# Patient Record
Sex: Female | Born: 1945 | Race: White | Hispanic: No | Smoking: Never smoker
Health system: Southern US, Community
[De-identification: ages and names within clinical notes are randomized; demographics above are authoritative.]

## PROBLEM LIST (undated history)

## (undated) DIAGNOSIS — E039 Hypothyroidism, unspecified: Secondary | ICD-10-CM

## (undated) DIAGNOSIS — E079 Disorder of thyroid, unspecified: Secondary | ICD-10-CM

## (undated) DIAGNOSIS — I10 Essential (primary) hypertension: Secondary | ICD-10-CM

## (undated) HISTORY — PX: BLADDER SURGERY: SHX569

---

## 2017-08-14 ENCOUNTER — Inpatient Hospital Stay (HOSPITAL_COMMUNITY)
Admission: EM | Admit: 2017-08-14 | Discharge: 2017-08-19 | DRG: 342 | Disposition: A | Discharge status: Home / Self Care | Payer: Medicare Other | Attending: General Surgery | Admitting: General Surgery

## 2017-08-14 ENCOUNTER — Other Ambulatory Visit: Payer: Self-pay

## 2017-08-14 ENCOUNTER — Emergency Department (HOSPITAL_COMMUNITY): Payer: Medicare Other | Admitting: Anesthesiology

## 2017-08-14 ENCOUNTER — Emergency Department (HOSPITAL_COMMUNITY): Payer: Medicare Other

## 2017-08-14 ENCOUNTER — Encounter (HOSPITAL_COMMUNITY): Admission: EM | Disposition: A | Discharge status: Home / Self Care | Payer: Self-pay | Source: Home / Self Care

## 2017-08-14 ENCOUNTER — Encounter (HOSPITAL_COMMUNITY): Payer: Self-pay | Admitting: Emergency Medicine

## 2017-08-14 DIAGNOSIS — E669 Obesity, unspecified: Secondary | ICD-10-CM | POA: Diagnosis present

## 2017-08-14 DIAGNOSIS — K567 Ileus, unspecified: Secondary | ICD-10-CM | POA: Diagnosis not present

## 2017-08-14 DIAGNOSIS — Z79899 Other long term (current) drug therapy: Secondary | ICD-10-CM

## 2017-08-14 DIAGNOSIS — Z7982 Long term (current) use of aspirin: Secondary | ICD-10-CM

## 2017-08-14 DIAGNOSIS — Z6824 Body mass index (BMI) 24.0-24.9, adult: Secondary | ICD-10-CM

## 2017-08-14 DIAGNOSIS — K3580 Unspecified acute appendicitis: Secondary | ICD-10-CM | POA: Diagnosis present

## 2017-08-14 DIAGNOSIS — E039 Hypothyroidism, unspecified: Secondary | ICD-10-CM | POA: Diagnosis present

## 2017-08-14 DIAGNOSIS — R9389 Abnormal findings on diagnostic imaging of other specified body structures: Secondary | ICD-10-CM | POA: Diagnosis present

## 2017-08-14 DIAGNOSIS — K3532 Acute appendicitis with perforation and localized peritonitis, without abscess: Secondary | ICD-10-CM | POA: Diagnosis present

## 2017-08-14 DIAGNOSIS — I10 Essential (primary) hypertension: Secondary | ICD-10-CM | POA: Diagnosis present

## 2017-08-14 DIAGNOSIS — K352 Acute appendicitis with generalized peritonitis, without abscess: Secondary | ICD-10-CM | POA: Diagnosis present

## 2017-08-14 DIAGNOSIS — E876 Hypokalemia: Secondary | ICD-10-CM | POA: Diagnosis not present

## 2017-08-14 HISTORY — PX: LAPAROSCOPIC APPENDECTOMY: SHX408

## 2017-08-14 HISTORY — DX: Hypothyroidism, unspecified: E03.9

## 2017-08-14 HISTORY — DX: Disorder of thyroid, unspecified: E07.9

## 2017-08-14 HISTORY — DX: Essential (primary) hypertension: I10

## 2017-08-14 LAB — CBC WITH DIFFERENTIAL/PLATELET
BASOS PCT: 0 %
Basophils Absolute: 0 10*3/uL (ref 0.0–0.1)
Eosinophils Absolute: 0 10*3/uL (ref 0.0–0.7)
Eosinophils Relative: 0 %
HEMATOCRIT: 40.5 % (ref 36.0–46.0)
HEMOGLOBIN: 13.7 g/dL (ref 12.0–15.0)
LYMPHS ABS: 0.6 10*3/uL — AB (ref 0.7–4.0)
LYMPHS PCT: 11 %
MCH: 30.2 pg (ref 26.0–34.0)
MCHC: 33.8 g/dL (ref 30.0–36.0)
MCV: 89.2 fL (ref 78.0–100.0)
MONO ABS: 0.5 10*3/uL (ref 0.1–1.0)
MONOS PCT: 8 %
NEUTROS ABS: 4.6 10*3/uL (ref 1.7–7.7)
NEUTROS PCT: 81 %
Platelets: 159 10*3/uL (ref 150–400)
RBC: 4.54 MIL/uL (ref 3.87–5.11)
RDW: 13.1 % (ref 11.5–15.5)
WBC: 5.7 10*3/uL (ref 4.0–10.5)

## 2017-08-14 LAB — URINALYSIS, ROUTINE W REFLEX MICROSCOPIC
BACTERIA UA: NONE SEEN
Bilirubin Urine: NEGATIVE
GLUCOSE, UA: NEGATIVE mg/dL
Hgb urine dipstick: NEGATIVE
Ketones, ur: 20 mg/dL — AB
NITRITE: NEGATIVE
PROTEIN: NEGATIVE mg/dL
SPECIFIC GRAVITY, URINE: 1.038 — AB (ref 1.005–1.030)
pH: 5 (ref 5.0–8.0)

## 2017-08-14 LAB — COMPREHENSIVE METABOLIC PANEL
ALBUMIN: 3.4 g/dL — AB (ref 3.5–5.0)
ALK PHOS: 86 U/L (ref 38–126)
ALT: 17 U/L (ref 14–54)
ANION GAP: 11 (ref 5–15)
AST: 26 U/L (ref 15–41)
BILIRUBIN TOTAL: 1 mg/dL (ref 0.3–1.2)
BUN: 18 mg/dL (ref 6–20)
CALCIUM: 8.4 mg/dL — AB (ref 8.9–10.3)
CO2: 23 mmol/L (ref 22–32)
Chloride: 96 mmol/L — ABNORMAL LOW (ref 101–111)
Creatinine, Ser: 0.81 mg/dL (ref 0.44–1.00)
GFR calc Af Amer: >60 mL/min (ref >60)
GLUCOSE: 125 mg/dL — AB (ref 65–99)
Potassium: 3.6 mmol/L (ref 3.5–5.1)
Sodium: 130 mmol/L — ABNORMAL LOW (ref 135–145)
TOTAL PROTEIN: 6 g/dL — AB (ref 6.5–8.1)

## 2017-08-14 LAB — LIPASE, BLOOD: LIPASE: 16 U/L (ref 11–51)

## 2017-08-14 SURGERY — APPENDECTOMY, LAPAROSCOPIC
Anesthesia: General

## 2017-08-14 MED ORDER — OXYCODONE HCL 5 MG PO TABS: 5.0000 mg | ORAL_TABLET | ORAL | Status: DC | PRN

## 2017-08-14 MED ORDER — ENOXAPARIN SODIUM 40 MG/0.4ML ~~LOC~~ SOLN
40.0000 mg | SUBCUTANEOUS | Status: DC
  Administered 2017-08-15 – 2017-08-19 (×5): 40 mg via SUBCUTANEOUS
  Filled 2017-08-14 (×5): qty 0.4

## 2017-08-14 MED ORDER — ONDANSETRON 4 MG PO TBDP
4.0000 mg | ORAL_TABLET | Freq: Four times a day (QID) | ORAL | Status: DC | PRN
  Filled 2017-08-14: qty 1

## 2017-08-14 MED ORDER — DEXAMETHASONE SODIUM PHOSPHATE 10 MG/ML IJ SOLN
INTRAMUSCULAR | Status: DC | PRN
  Administered 2017-08-14: 10 mg via INTRAVENOUS

## 2017-08-14 MED ORDER — MORPHINE SULFATE (PF) 2 MG/ML IV SOLN: 2.0000 mg | INTRAVENOUS | Status: DC | PRN

## 2017-08-14 MED ORDER — SCOPOLAMINE 1 MG/3DAYS TD PT72
MEDICATED_PATCH | TRANSDERMAL | Status: DC | PRN
  Administered 2017-08-14: 1 via TRANSDERMAL

## 2017-08-14 MED ORDER — ONDANSETRON HCL 4 MG/2ML IJ SOLN
INTRAMUSCULAR | Status: DC | PRN
  Administered 2017-08-14: 4 mg via INTRAVENOUS

## 2017-08-14 MED ORDER — SUGAMMADEX SODIUM 200 MG/2ML IV SOLN
INTRAVENOUS | Status: DC | PRN
  Administered 2017-08-14: 131.6 mg via INTRAVENOUS

## 2017-08-14 MED ORDER — FENTANYL CITRATE (PF) 100 MCG/2ML IJ SOLN
INTRAMUSCULAR | Status: DC | PRN
  Administered 2017-08-14 (×2): 50 ug via INTRAVENOUS

## 2017-08-14 MED ORDER — BUPIVACAINE-EPINEPHRINE (PF) 0.25% -1:200000 IJ SOLN
INTRAMUSCULAR | Status: AC
  Filled 2017-08-14: qty 30

## 2017-08-14 MED ORDER — BUPIVACAINE-EPINEPHRINE 0.25% -1:200000 IJ SOLN
INTRAMUSCULAR | Status: DC | PRN
  Administered 2017-08-14: 15 mL

## 2017-08-14 MED ORDER — FENTANYL CITRATE (PF) 100 MCG/2ML IJ SOLN
50.0000 ug | Freq: Once | INTRAMUSCULAR | Status: AC | PRN
  Administered 2017-08-14: 50 ug via INTRAVENOUS
  Filled 2017-08-14: qty 2

## 2017-08-14 MED ORDER — SUGAMMADEX SODIUM 200 MG/2ML IV SOLN
INTRAVENOUS | Status: AC
  Filled 2017-08-14: qty 2

## 2017-08-14 MED ORDER — LACTATED RINGERS IV SOLN
INTRAVENOUS | Status: DC
  Administered 2017-08-14 – 2017-08-18 (×6): via INTRAVENOUS

## 2017-08-14 MED ORDER — LIDOCAINE 2% (20 MG/ML) 5 ML SYRINGE
INTRAMUSCULAR | Status: DC | PRN
  Administered 2017-08-14: 50 mg via INTRAVENOUS

## 2017-08-14 MED ORDER — SODIUM CHLORIDE 0.9 % IV BOLUS (SEPSIS)
1000.0000 mL | Freq: Once | INTRAVENOUS | Status: AC
  Administered 2017-08-14: 1000 mL via INTRAVENOUS

## 2017-08-14 MED ORDER — LACTATED RINGERS IR SOLN
Status: DC | PRN
  Administered 2017-08-14: 1000 mL

## 2017-08-14 MED ORDER — LACTATED RINGERS IV SOLN
INTRAVENOUS | Status: DC
  Administered 2017-08-14: 1000 mL via INTRAVENOUS
  Administered 2017-08-14: 10:00:00 via INTRAVENOUS

## 2017-08-14 MED ORDER — PIPERACILLIN-TAZOBACTAM 3.375 G IVPB
3.3750 g | Freq: Three times a day (TID) | INTRAVENOUS | Status: DC
  Administered 2017-08-14 – 2017-08-19 (×15): 3.375 g via INTRAVENOUS
  Filled 2017-08-14 (×15): qty 50

## 2017-08-14 MED ORDER — PIPERACILLIN-TAZOBACTAM 3.375 G IVPB 30 MIN
3.3750 g | Freq: Once | INTRAVENOUS | Status: AC
  Administered 2017-08-14: 3.375 g via INTRAVENOUS
  Filled 2017-08-14: qty 50

## 2017-08-14 MED ORDER — SUCCINYLCHOLINE CHLORIDE 200 MG/10ML IV SOSY
PREFILLED_SYRINGE | INTRAVENOUS | Status: DC | PRN
  Administered 2017-08-14: 100 mg via INTRAVENOUS

## 2017-08-14 MED ORDER — PROMETHAZINE HCL 25 MG/ML IJ SOLN: 6.2500 mg | INTRAMUSCULAR | Status: DC | PRN

## 2017-08-14 MED ORDER — LACTATED RINGERS IV SOLN
INTRAVENOUS | Status: DC | PRN
  Administered 2017-08-14 (×2): via INTRAVENOUS

## 2017-08-14 MED ORDER — LOSARTAN POTASSIUM 25 MG PO TABS
12.5000 mg | ORAL_TABLET | Freq: Every day | ORAL | Status: DC
  Administered 2017-08-14 – 2017-08-19 (×6): 12.5 mg via ORAL
  Filled 2017-08-14 (×8): qty 1

## 2017-08-14 MED ORDER — ROCURONIUM BROMIDE 10 MG/ML (PF) SYRINGE
PREFILLED_SYRINGE | INTRAVENOUS | Status: DC | PRN
  Administered 2017-08-14: 30 mg via INTRAVENOUS

## 2017-08-14 MED ORDER — SCOPOLAMINE 1 MG/3DAYS TD PT72
MEDICATED_PATCH | TRANSDERMAL | Status: AC
  Filled 2017-08-14: qty 1

## 2017-08-14 MED ORDER — DIPHENHYDRAMINE HCL 50 MG/ML IJ SOLN
INTRAMUSCULAR | Status: DC | PRN
  Administered 2017-08-14: 12.5 mg via INTRAVENOUS

## 2017-08-14 MED ORDER — DEXAMETHASONE SODIUM PHOSPHATE 10 MG/ML IJ SOLN
INTRAMUSCULAR | Status: AC
  Filled 2017-08-14: qty 1

## 2017-08-14 MED ORDER — PROPOFOL 10 MG/ML IV BOLUS
INTRAVENOUS | Status: DC | PRN
  Administered 2017-08-14: 150 mg via INTRAVENOUS

## 2017-08-14 MED ORDER — IOPAMIDOL (ISOVUE-300) INJECTION 61%
INTRAVENOUS | Status: AC
  Administered 2017-08-14: 100 mL via INTRAVENOUS
  Filled 2017-08-14: qty 100

## 2017-08-14 MED ORDER — LEVOTHYROXINE SODIUM 75 MCG PO TABS
75.0000 ug | ORAL_TABLET | Freq: Every day | ORAL | Status: DC
  Administered 2017-08-14 – 2017-08-19 (×6): 75 ug via ORAL
  Filled 2017-08-14 (×6): qty 1

## 2017-08-14 MED ORDER — PHENYLEPHRINE 40 MCG/ML (10ML) SYRINGE FOR IV PUSH (FOR BLOOD PRESSURE SUPPORT)
PREFILLED_SYRINGE | INTRAVENOUS | Status: DC | PRN
  Administered 2017-08-14 (×2): 120 ug via INTRAVENOUS

## 2017-08-14 MED ORDER — ONDANSETRON HCL 4 MG/2ML IJ SOLN
4.0000 mg | Freq: Once | INTRAMUSCULAR | Status: AC
  Administered 2017-08-14: 4 mg via INTRAVENOUS
  Filled 2017-08-14: qty 2

## 2017-08-14 MED ORDER — SODIUM CHLORIDE 0.9 % IR SOLN
Status: DC | PRN
  Administered 2017-08-14: 3000 mL

## 2017-08-14 MED ORDER — ONDANSETRON HCL 4 MG/2ML IJ SOLN
4.0000 mg | Freq: Four times a day (QID) | INTRAMUSCULAR | Status: DC | PRN
  Administered 2017-08-16 – 2017-08-17 (×3): 4 mg via INTRAVENOUS
  Filled 2017-08-14 (×3): qty 2

## 2017-08-14 MED ORDER — IOPAMIDOL (ISOVUE-300) INJECTION 61%
100.0000 mL | Freq: Once | INTRAVENOUS | Status: AC | PRN
  Administered 2017-08-14: 100 mL via INTRAVENOUS

## 2017-08-14 MED ORDER — FENTANYL CITRATE (PF) 100 MCG/2ML IJ SOLN
50.0000 ug | Freq: Once | INTRAMUSCULAR | Status: AC
  Administered 2017-08-14: 50 ug via INTRAVENOUS
  Filled 2017-08-14: qty 2

## 2017-08-14 MED ORDER — HYDROMORPHONE HCL 1 MG/ML IJ SOLN: 0.2500 mg | INTRAMUSCULAR | Status: DC | PRN

## 2017-08-14 MED ORDER — FENTANYL CITRATE (PF) 100 MCG/2ML IJ SOLN
INTRAMUSCULAR | Status: AC
  Filled 2017-08-14: qty 2

## 2017-08-14 MED ORDER — ACETAMINOPHEN 325 MG PO TABS
650.0000 mg | ORAL_TABLET | Freq: Four times a day (QID) | ORAL | Status: DC | PRN
Start: 2017-08-14 — End: 2017-08-19

## 2017-08-14 MED ORDER — DIPHENHYDRAMINE HCL 50 MG/ML IJ SOLN
INTRAMUSCULAR | Status: AC
  Filled 2017-08-14: qty 1

## 2017-08-14 MED ORDER — ACETAMINOPHEN 650 MG RE SUPP: 650.0000 mg | Freq: Four times a day (QID) | RECTAL | Status: DC | PRN

## 2017-08-14 SURGICAL SUPPLY — 34 items
APPLIER CLIP 5 13 M/L LIGAMAX5 (MISCELLANEOUS)
APPLIER CLIP ROT 10 11.4 M/L (STAPLE)
CABLE HIGH FREQUENCY MONO STRZ (ELECTRODE) IMPLANT
CHLORAPREP W/TINT 26ML (MISCELLANEOUS) ×3 IMPLANT
CLIP APPLIE 5 13 M/L LIGAMAX5 (MISCELLANEOUS) IMPLANT
CLIP APPLIE ROT 10 11.4 M/L (STAPLE) IMPLANT
COVER SURGICAL LIGHT HANDLE (MISCELLANEOUS) ×3 IMPLANT
CUTTER FLEX LINEAR 45M (STAPLE) ×3 IMPLANT
DECANTER SPIKE VIAL GLASS SM (MISCELLANEOUS) ×3 IMPLANT
DERMABOND ADVANCED (GAUZE/BANDAGES/DRESSINGS) ×2
DERMABOND ADVANCED .7 DNX12 (GAUZE/BANDAGES/DRESSINGS) ×1 IMPLANT
DRAPE LAPAROSCOPIC ABDOMINAL (DRAPES) ×3 IMPLANT
ELECT REM PT RETURN 15FT ADLT (MISCELLANEOUS) ×3 IMPLANT
GLOVE BIOGEL PI IND STRL 7.5 (GLOVE) ×1 IMPLANT
GLOVE BIOGEL PI INDICATOR 7.5 (GLOVE) ×2
GLOVE ECLIPSE 7.5 STRL STRAW (GLOVE) ×3 IMPLANT
GOWN STRL REUS W/TWL XL LVL3 (GOWN DISPOSABLE) ×6 IMPLANT
KIT BASIN OR (CUSTOM PROCEDURE TRAY) ×3 IMPLANT
PAD POSITIONING PINK XL (MISCELLANEOUS) ×3 IMPLANT
POUCH SPECIMEN RETRIEVAL 10MM (ENDOMECHANICALS) ×3 IMPLANT
RELOAD 45 VASCULAR/THIN (ENDOMECHANICALS) IMPLANT
RELOAD STAPLE TA45 3.5 REG BLU (ENDOMECHANICALS) ×3 IMPLANT
SCISSORS LAP 5X35 DISP (ENDOMECHANICALS) ×3 IMPLANT
SET IRRIG TUBING LAPAROSCOPIC (IRRIGATION / IRRIGATOR) ×3 IMPLANT
SHEARS HARMONIC ACE PLUS 36CM (ENDOMECHANICALS) ×3 IMPLANT
SLEEVE XCEL OPT CAN 5 100 (ENDOMECHANICALS) ×3 IMPLANT
SUT MNCRL AB 4-0 PS2 18 (SUTURE) ×3 IMPLANT
TOWEL OR 17X26 10 PK STRL BLUE (TOWEL DISPOSABLE) ×3 IMPLANT
TRAY FOLEY BAG SILVER LF 14FR (CATHETERS) ×3 IMPLANT
TRAY FOLEY W/METER SILVER 16FR (SET/KITS/TRAYS/PACK) IMPLANT
TRAY LAPAROSCOPIC (CUSTOM PROCEDURE TRAY) ×3 IMPLANT
TROCAR BLADELESS OPT 5 100 (ENDOMECHANICALS) ×3 IMPLANT
TROCAR XCEL BLUNT TIP 100MML (ENDOMECHANICALS) ×3 IMPLANT
TUBING INSUF HEATED (TUBING) ×3 IMPLANT

## 2017-08-14 NOTE — Op Note (Signed)
Preoperative Diagnosis: Acute appendicitis, unspecified acute appendicitis type [K35.80]  Postoprative Diagnosis: Perforated appendicitis with diffuse peritonitis  Procedure: Procedure(s): APPENDECTOMY LAPAROSCOPIC    Surgeon: Glenna FellowsHoxworth, Judson Tsan T   Assistants: None  Anesthesia:  General endotracheal anesthesia  Indications: Patient is a 71 year old female who presents with 48 hours of progressive lower abdominal pain with nausea and vomiting.  CT scan has shown evidence of acute appendicitis and extensive inflammatory process in the right lower quadrant with possible perforation.  She is markedly tender.  After discussion we have elected to proceed with emergency laparoscopic and possible open appendectomy.  The procedure and alternatives and risks have been discussed in detailed elsewhere and she agrees to proceed.    Procedure Detail: Patient had received preoperative broad-spectrum IV antibiotics and IV fluids.  She was taken to the operating room, placed in the supine position on the operating table, and general endotracheal anesthesia induced.  The abdomen was widely sterilely prepped and draped.  Foley catheter had been placed.  Patient timeout was performed and correct procedure verified.  Access was obtained with a 1-1/2 cm incision at the umbilicus with an open Hassan cannula through a mattress suture of 0 Vicryl and pneumoperitoneum established.  Under direct vision 5 mm trochars were placed in the epigastrium and left lower quadrant.  There was diffuse exudative peritonitis and some feculent contamination along the right paracolic gutter and in the right lower quadrant.  Inflammatory small bowel adhesions were carefully broken up and small bowel loops mobilized out of the right lower quadrant and off of the cecum.  The appendix was exposed lying just beneath the cecum.  It was severely inflamed with a area of gangrene and perforation about a centimeter from the base of the appendix.   However the appendix could be well exposed and the anatomy delineated.  Peritoneal attachments were divided laterally mobilizing the appendix and tip of the cecum.  The thickened peritoneum and mesoappendix were then sequentially divided with the harmonic scalpel.  I continued to dissect along the appendix proximal to the perforation and inflammatory adhesions to the cecum were carefully taken down with mostly careful blunt dissection and further mesoappendix divided with the harmonic scalpel until the appendix was completely freed down to the tip of the cecum.  This was healthy viable tissue about a centimeter to a centimeter and a half proximal to the perforation.  The appendix was divided along the tip of the cecum with a single firing of the 45 mm blue load stapler.  The appendix was placed in an Endo Catch bag and brought out through the umbilical incision.  The abdomen was then thoroughly irrigated with multiple liters of saline.  All interloop inflammatory adhesions were carefully broken up and irrigated.  The pelvis and cul-de-sac were thoroughly irrigated from purulent and somewhat feculent fluid.  Right subhepatic space contained purulent and slightly feculent fluid that was thoroughly irrigated and suctioned.  At the conclusion there was no bleeding or further evidence of contamination remaining or evidence of injury.  CT scan had indicated ovarian cysts and I did take photographs of both ovaries which appeared normal but there was a simple appearing cyst associated with the right fallopian tube.  The abdomen was again inspected and no problems seen.  All CO2 was evacuated and trochars removed and the mattress suture secured to the umbilicus.  Skin incisions were closed with subcuticular Monocryl and Dermabond.  Sponge needle and instrument counts were correct.    Findings: Perforated appendicitis with  diffuse peritonitis  Estimated Blood Loss:  Minimal         Drains: None  Blood Given: none           Specimens: Appendix        Complications:  * No complications entered in OR log *         Disposition: PACU - hemodynamically stable.         Condition: stable

## 2017-08-14 NOTE — ED Provider Notes (Signed)
[facility]-EMERGENCY DEPT Provider Note   CSN: [id number] Arrival date & time: 08/14/17  0047    History   Chief Complaint Chief Complaint  Patient presents with  . Abdominal Pain    HPI Harless NakayamaWinnie Endicott is a 71 y.o. female.   71 year old female presents to the emergency department for evaluation of abdominal pain.  She states that abdominal pain began on Wednesday morning in her upper abdomen and has since migrated to her lower abdomen.  Initially pain was intermittent, but it has become more constant tonight; waxing and waning in severity.  She reports a temperature of 100.2 F prior to arrival for which she took Aleve.  She is concerned that her symptoms may be secondary to consumption of Romaine lettuce on Tuesday.  She has had anorexia with decreased stooling, but no diarrhea, melena, hematochezia. No dysuria, hematuria, vomiting. She does c/o some nausea.  No hx of abdominal surgeries.  Last BM was today, small.      Past Medical History:  Diagnosis Date  . Hypertension   . Hypothyroidism   . Thyroid disease     There are no active problems to display for this patient.   Past Surgical History:  Procedure Laterality Date  . BLADDER SURGERY      OB History    No data available       Home Medications    Prior to Admission medications   Not on File    Family History History reviewed. No pertinent family history.  Social History Social History   Tobacco Use  . Smoking status: Never Smoker  . Smokeless tobacco: Never Used  Substance Use Topics  . Alcohol use: No    Frequency: Never  . Drug use: No     Allergies   Patient has no known allergies.   Review of Systems Review of Systems Ten systems reviewed and are negative for acute change, except as noted in the HPI.    Physical Exam Updated Vital Signs BP 104/64   Pulse 72   Temp 97.6 F (36.4 C) (Oral)   Resp 18   Ht 5' 4.5" (1.638 m)   Wt 65.8 kg (145 lb)   SpO2  91%   BMI 24.50 kg/m   Physical Exam  Constitutional: She is oriented to person, place, and time. She appears well-developed and well-nourished. No distress.  Nontoxic appearing and in NAD  HENT:  Head: Normocephalic and atraumatic.  Eyes: Conjunctivae and EOM are normal. No scleral icterus.  Neck: Normal range of motion.  Cardiovascular: Normal rate, regular rhythm and intact distal pulses.  Pulmonary/Chest: Effort normal. No stridor. No respiratory distress. She has no wheezes.  Lungs CTAB  Abdominal: Soft. There is tenderness (generalized, but worse in the lower abdomen). There is guarding (voluntary).  Musculoskeletal: Normal range of motion.  Neurological: She is alert and oriented to person, place, and time. She exhibits normal muscle tone. Coordination normal.  GCS 15. Patient moving all extremities.  Skin: Skin is warm and dry. No rash noted. She is not diaphoretic. No erythema. No pallor.  Psychiatric: She has a normal mood and affect. Her behavior is normal.  Nursing note and vitals reviewed.    ED Treatments / Results  Labs (all labs ordered are listed, but only abnormal results are displayed) Labs Reviewed  CBC WITH DIFFERENTIAL/PLATELET - Abnormal; Notable for the following components:      Result Value   Lymphs Abs 0.6 (*)    All other components  within normal limits  COMPREHENSIVE METABOLIC PANEL - Abnormal; Notable for the following components:   Sodium 130 (*)    Chloride 96 (*)    Glucose, Bld 125 (*)    Calcium 8.4 (*)    Total Protein 6.0 (*)    Albumin 3.4 (*)    All other components within normal limits  URINALYSIS, ROUTINE W REFLEX MICROSCOPIC - Abnormal; Notable for the following components:   Specific Gravity, Urine 1.038 (*)    Ketones, ur 20 (*)    Leukocytes, UA TRACE (*)    Squamous Epithelial / LPF 0-5 (*)    All other components within normal limits  LIPASE, BLOOD    EKG  EKG Interpretation None       Radiology Ct Abdomen Pelvis  W Contrast  Result Date: 08/14/2017 CLINICAL DATA:  Lower abdominal pain. Romain lettuce ingestion 08/11/2017, 3 days prior. EXAM: CT ABDOMEN AND PELVIS WITH CONTRAST TECHNIQUE: Multidetector CT imaging of the abdomen and pelvis was performed using the standard protocol following bolus administration of intravenous contrast. CONTRAST:  100 cc Isovue-300 IV COMPARISON:  None. FINDINGS: Lower chest: Small right pleural effusion and lower lobe atelectasis. Streaky lingular and left lower lobe atelectasis. Hepatobiliary: No focal hepatic lesion. Gallbladder is physiologically distended without calcified gallstone. Small amount stranding about the gallbladder fundus is felt be tracking from right lower quadrant inflammatory process. Pancreas: No ductal dilatation or inflammation. Spleen: Normal in size without focal abnormality. Adrenals/Urinary Tract: No adrenal nodule. Small low-density lesions throughout both kidneys, majority are subcentimeter and too small to characterized, larger lesion in the lower right kidney is consistent with simple cyst. Urinary bladder is physiologically distended. Stomach/Bowel: Stomach is nondistended. Proximal small bowel is decompressed. Pelvic small bowel loops are fluid-filled with wall thickening and mild mucosal enhancement. Dilated blind-ending tubular structure in the right lower quadrant consistent with dilated appendix. Significant right lower quadrant inflammatory change. Cecum is located into the right mid abdomen. Significant colonic tortuosity. Moderate sigmoid diverticulosis without diverticulitis. Appendix: Location: Retrocecal, however cecum located in the mid abdomen. Diameter: 16 mm. Appendicolith: Yes, at least 3, largest measuring 8 mm at the base. Mucosal hyper-enhancement: Yes. Extraluminal gas: No. Periappendiceal collection: Free fluid in the right lower quadrant and right pericolic gutter, with possible organizing collection in the pericolic gutter, for example  image 50 series 2. Vascular/Lymphatic: Tortuous abdominal aorta which trace atherosclerosis. No enlarged abdominal or pelvic lymph nodes. Reproductive: Multi septated versus 2 adjacent cysts in the left ovary, with cystic components measuring 13 and 19 mm. Right ovary not definitively seen. Probable exophytic partially calcified uterine fibroid on the left. Endometrial thickening or fluid measuring 12 mm in the fundus. Other: Significant inflammatory change in the pelvis and right lower quadrant with soft tissue stranding and free fluid. Mesenteric edema and free fluid. Free fluid tracks in both pericolic gutters, left greater than right, with possible ill-defined organizing collection in the right lower quadrant. No free air. Musculoskeletal: Scoliosis and degenerative change in the spine. There are no acute or suspicious osseous abnormalities. IMPRESSION: 1. Findings consistent with acute appendicitis. Significantly inflamed appendix containing multiple appendicoliths. The degree of inflammation, free fluid and probable developing organizing fluid collection in the right pericolic gutter are suspicious for rupture. No extraluminal air. Cecum is located in the right mid abdomen partially distorting conventional anatomy. 2. Thickened pelvic bowel loops with mucosal enhancement likely reactive secondary to the right lower quadrant inflammatory process. 3. Colonic diverticulosis without diverticulitis. 4. Left ovarian cysts versus septated  cyst. If 2 adjacent cysts, cysts measures 19 and 13 mm respectively. These are probably benign, however recommend sonographic characterization on a nonemergent basis after coalescence of acute event. Additionally there is endometrial thickening or fluid of 12 mm, abnormal in a postmenopausal patient. This should also be assessed with ultrasound. Electronically Signed   By: Rubye OaksMelanie  Ehinger M.D.   On: 08/14/2017 04:04    Procedures Procedures (including critical care  time)  Medications Ordered in ED Medications  piperacillin-tazobactam (ZOSYN) IVPB 3.375 g (not administered)  sodium chloride 0.9 % bolus 1,000 mL (not administered)  fentaNYL (SUBLIMAZE) injection 50 mcg (50 mcg Intravenous Given 08/14/17 0143)  ondansetron (ZOFRAN) injection 4 mg (4 mg Intravenous Given 08/14/17 0143)  sodium chloride 0.9 % bolus 1,000 mL (0 mLs Intravenous Stopped 08/14/17 0327)  iopamidol (ISOVUE-300) 61 % injection 100 mL (100 mLs Intravenous Contrast Given 08/14/17 0328)    4:35 AM Zosyn ordered. Case discussed with Dr. Johna SheriffHoxworth of general surgery who will evaluate; anticipate admission.   Initial Impression / Assessment and Plan / ED Course  I have reviewed the triage vital signs and the nursing notes.  Pertinent labs & imaging results that were available during my care of the patient were reviewed by me and considered in my medical decision making (see chart for details).     71 year old female presents to the emergency department for evaluation of abdominal pain with associated nausea and low-grade fever of 100.2 F at home.  She has a generally tender abdomen which is appreciated to be worse on palpation of the bilateral lower quadrants.  Laboratory workup reassuring and vitals have been stable.  A CT was obtained which shows findings concerning for acute appendicitis; question rupture. Zosyn initiated given age and CT read. CCS to evaluate in the ED; anticipate admission to surgical service.   Final Clinical Impressions(s) / ED Diagnoses   Final diagnoses:  Acute appendicitis, unspecified acute appendicitis type    ED Discharge Orders    None       Antony MaduraHumes, Kamorah Nevils, PA-C 08/14/17 [id number]    [name], Christopher J, MD 08/14/17 [phone]

## 2017-08-14 NOTE — ED Triage Notes (Signed)
Pt brought in by EMS from home  Pt ate romaine lettuce Tuesday evening and then heard about the e-coli outbreak [facility]  Pt states her abdomen started hurting tueday night after eating the lettuce and has been hurting ever since progressively getting worse  Pt had one episode of vomiting today  Pt reports having fevers that she has been taking aleve for  Pt is c/o lower abd pain

## 2017-08-14 NOTE — H&P (Signed)
Miranda Randall is an 71 y.o. female.    Chief Complaint: Abdominal pain  HPI: Patient is a pleasant 71 year old female who about 48 hours ago developed the gradual onset of aching epigastric pain.  This was followed soon after by nausea and vomiting.  She rested in bed all day thinking she might have food poisoning.  She did have some diarrhea as well.  Over the course of the day the pain gradually worsened and began to move into her lower abdomen.  Yesterday the pain persisted and her lower abdomen.  She describes aching pain across her lower abdomen greater in the right than the left.  She had continued nausea.  No urinary symptoms.  The pain gradually intensified and she presented to the emergency room late last night/early this morning.  Denies any chronic GI symptoms or similar previous illnesses.  Past Medical History:  Diagnosis Date  . Hypertension   . Hypothyroidism   . Thyroid disease     Past Surgical History:  Procedure Laterality Date  . BLADDER SURGERY      History reviewed. No pertinent family history. Social History:  reports that  has never smoked. she has never used smokeless tobacco. She reports that she does not drink alcohol or use drugs.  Allergies: No Known Allergies  No current facility-administered medications for this encounter.    Current Outpatient Medications  Medication Sig Dispense Refill  . aspirin 81 MG chewable tablet Chew 81 mg by mouth daily.    . calcium carbonate (OS-CAL - DOSED IN MG OF ELEMENTAL CALCIUM) 1250 (500 Ca) MG tablet Take 1 tablet by mouth daily with breakfast.    . cholecalciferol (VITAMIN D) 1000 units tablet Take 1,000 Units by mouth daily.    Marland Kitchen levothyroxine (SYNTHROID, LEVOTHROID) 75 MCG tablet Take 75 mcg by mouth daily.    Marland Kitchen losartan (COZAAR) 25 MG tablet Take 12.5 mg by mouth daily.    . Multiple Vitamin (MULTIVITAMIN WITH MINERALS) TABS tablet Take 1 tablet by mouth daily.       Results for orders placed or performed  during the hospital encounter of 08/14/17 (from the past 48 hour(s))  CBC with Differential     Status: Abnormal   Collection Time: 08/14/17 12:55 AM  Result Value Ref Range   WBC 5.7 4.0 - 10.5 K/uL   RBC 4.54 3.87 - 5.11 MIL/uL   Hemoglobin 13.7 12.0 - 15.0 g/dL   HCT 40.5 36.0 - 46.0 %   MCV 89.2 78.0 - 100.0 fL   MCH 30.2 26.0 - 34.0 pg   MCHC 33.8 30.0 - 36.0 g/dL   RDW 13.1 11.5 - 15.5 %   Platelets 159 150 - 400 K/uL   Neutrophils Relative % 81 %   Neutro Abs 4.6 1.7 - 7.7 K/uL   Lymphocytes Relative 11 %   Lymphs Abs 0.6 (L) 0.7 - 4.0 K/uL   Monocytes Relative 8 %   Monocytes Absolute 0.5 0.1 - 1.0 K/uL   Eosinophils Relative 0 %   Eosinophils Absolute 0.0 0.0 - 0.7 K/uL   Basophils Relative 0 %   Basophils Absolute 0.0 0.0 - 0.1 K/uL  Comprehensive metabolic panel     Status: Abnormal   Collection Time: 08/14/17 12:55 AM  Result Value Ref Range   Sodium 130 (L) 135 - 145 mmol/L   Potassium 3.6 3.5 - 5.1 mmol/L   Chloride 96 (L) 101 - 111 mmol/L   CO2 23 22 - 32 mmol/L   Glucose, Bld  125 (H) 65 - 99 mg/dL   BUN 18 6 - 20 mg/dL   Creatinine, Ser 0.81 0.44 - 1.00 mg/dL   Calcium 8.4 (L) 8.9 - 10.3 mg/dL   Total Protein 6.0 (L) 6.5 - 8.1 g/dL   Albumin 3.4 (L) 3.5 - 5.0 g/dL   AST 26 15 - 41 U/L   ALT 17 14 - 54 U/L   Alkaline Phosphatase 86 38 - 126 U/L   Total Bilirubin 1.0 0.3 - 1.2 mg/dL   GFR calc non Af Amer >60 >60 mL/min   GFR calc Af Amer >60 >60 mL/min    Comment: (NOTE) The eGFR has been calculated using the CKD EPI equation. This calculation has not been validated in all clinical situations. eGFR's persistently <60 mL/min signify possible Chronic Kidney Disease.    Anion gap 11 5 - 15  Lipase, blood     Status: None   Collection Time: 08/14/17 12:55 AM  Result Value Ref Range   Lipase 16 11 - 51 U/L  Urinalysis, Routine w reflex microscopic     Status: Abnormal   Collection Time: 08/14/17 12:55 AM  Result Value Ref Range   Color, Urine YELLOW  YELLOW   APPearance CLEAR CLEAR   Specific Gravity, Urine 1.038 (H) 1.005 - 1.030   pH 5.0 5.0 - 8.0   Glucose, UA NEGATIVE NEGATIVE mg/dL   Hgb urine dipstick NEGATIVE NEGATIVE   Bilirubin Urine NEGATIVE NEGATIVE   Ketones, ur 20 (A) NEGATIVE mg/dL   Protein, ur NEGATIVE NEGATIVE mg/dL   Nitrite NEGATIVE NEGATIVE   Leukocytes, UA TRACE (A) NEGATIVE   RBC / HPF 0-5 0 - 5 RBC/hpf   WBC, UA 0-5 0 - 5 WBC/hpf   Bacteria, UA NONE SEEN NONE SEEN   Squamous Epithelial / LPF 0-5 (A) NONE SEEN   Mucus PRESENT    Hyaline Casts, UA PRESENT    Ct Abdomen Pelvis W Contrast  Result Date: 08/14/2017 CLINICAL DATA:  Lower abdominal pain. Romain lettuce ingestion 08/11/2017, 3 days prior. EXAM: CT ABDOMEN AND PELVIS WITH CONTRAST TECHNIQUE: Multidetector CT imaging of the abdomen and pelvis was performed using the standard protocol following bolus administration of intravenous contrast. CONTRAST:  100 cc Isovue-300 IV COMPARISON:  None. FINDINGS: Lower chest: Small right pleural effusion and lower lobe atelectasis. Streaky lingular and left lower lobe atelectasis. Hepatobiliary: No focal hepatic lesion. Gallbladder is physiologically distended without calcified gallstone. Small amount stranding about the gallbladder fundus is felt be tracking from right lower quadrant inflammatory process. Pancreas: No ductal dilatation or inflammation. Spleen: Normal in size without focal abnormality. Adrenals/Urinary Tract: No adrenal nodule. Small low-density lesions throughout both kidneys, majority are subcentimeter and too small to characterized, larger lesion in the lower right kidney is consistent with simple cyst. Urinary bladder is physiologically distended. Stomach/Bowel: Stomach is nondistended. Proximal small bowel is decompressed. Pelvic small bowel loops are fluid-filled with wall thickening and mild mucosal enhancement. Dilated blind-ending tubular structure in the right lower quadrant consistent with dilated  appendix. Significant right lower quadrant inflammatory change. Cecum is located into the right mid abdomen. Significant colonic tortuosity. Moderate sigmoid diverticulosis without diverticulitis. Appendix: Location: Retrocecal, however cecum located in the mid abdomen. Diameter: 16 mm. Appendicolith: Yes, at least 3, largest measuring 8 mm at the base. Mucosal hyper-enhancement: Yes. Extraluminal gas: No. Periappendiceal collection: Free fluid in the right lower quadrant and right pericolic gutter, with possible organizing collection in the pericolic gutter, for example image 50 series 2. Vascular/Lymphatic: Tortuous  abdominal aorta which trace atherosclerosis. No enlarged abdominal or pelvic lymph nodes. Reproductive: Multi septated versus 2 adjacent cysts in the left ovary, with cystic components measuring 13 and 19 mm. Right ovary not definitively seen. Probable exophytic partially calcified uterine fibroid on the left. Endometrial thickening or fluid measuring 12 mm in the fundus. Other: Significant inflammatory change in the pelvis and right lower quadrant with soft tissue stranding and free fluid. Mesenteric edema and free fluid. Free fluid tracks in both pericolic gutters, left greater than right, with possible ill-defined organizing collection in the right lower quadrant. No free air. Musculoskeletal: Scoliosis and degenerative change in the spine. There are no acute or suspicious osseous abnormalities. IMPRESSION: 1. Findings consistent with acute appendicitis. Significantly inflamed appendix containing multiple appendicoliths. The degree of inflammation, free fluid and probable developing organizing fluid collection in the right pericolic gutter are suspicious for rupture. No extraluminal air. Cecum is located in the right mid abdomen partially distorting conventional anatomy. 2. Thickened pelvic bowel loops with mucosal enhancement likely reactive secondary to the right lower quadrant inflammatory  process. 3. Colonic diverticulosis without diverticulitis. 4. Left ovarian cysts versus septated cyst. If 2 adjacent cysts, cysts measures 19 and 13 mm respectively. These are probably benign, however recommend sonographic characterization on a nonemergent basis after coalescence of acute event. Additionally there is endometrial thickening or fluid of 12 mm, abnormal in a postmenopausal patient. This should also be assessed with ultrasound. Electronically Signed   By: Jeb Levering M.D.   On: 08/14/2017 04:04    Review of Systems  Constitutional: Positive for malaise/fatigue. Negative for chills and fever.  Respiratory: Negative.   Cardiovascular: Negative.   Gastrointestinal: Positive for abdominal pain, diarrhea, nausea and vomiting. Negative for blood in stool and constipation.  Genitourinary: Negative.     Blood pressure (!) 103/53, pulse 69, temperature 97.6 F (36.4 C), temperature source Oral, resp. rate 18, height 5' 4.5" (1.638 m), weight 65.8 kg (145 lb), SpO2 90 %. Physical Exam  General: Alert, mildly obese Caucasian female, in no distress Skin: Warm and dry without rash or infection. HEENT: No palpable masses or thyromegaly. Sclera nonicteric. Pupils equal round and reactive. Lymph nodes: No cervical, supraclavicular,  nodes palpable. Lungs: Breath sounds clear and equal without increased work of breathing Cardiovascular: Regular rate and rhythm without murmur. No JVD or edema. Peripheral pulses intact. Abdomen: Nondistended.  Bowel sounds are hypoactive.  There is tenderness across the lower abdomen with guarding but greater on the right than the left.  Some tenderness in the right upper quadrant and left upper quadrant relatively soft and nontender.  No masses palpable. No organomegaly. No palpable hernias. Extremities: No edema or joint swelling or deformity. No chronic venous stasis changes. Neurologic: Alert and fully oriented.  Affect normal.  No motor  deficits.  Assessment/Plan 48-hour illness with abdominal pain and nausea and vomiting, marked lower abdominal tenderness greater right than left and CT scan showing evidence of significant appendicitis with appendicolith, possible perforation with fairly extensive inflammatory process in the right lower quadrant.  There is no drainable fluid collection.  I think the best course is to proceed with appendectomy.  We will approach laparoscopically but discussed this may require an open procedure.  Discussed alternatives of treatment with antibiotics which since she is only been sick for 48 hours and no drainable fluid collection I would lean toward immediate surgery.  This is what she firmly wants.  We discussed risks of surgery including anesthetic or medication reactions,  cardiorespiratory complications, bleeding, infection, visceral injury.  All questions answered and she desires to proceed.  Edward Jolly, MD 08/14/2017, 6:35 AM

## 2017-08-14 NOTE — ED Notes (Signed)
Dr. Johna SheriffHoxworth, general surgeon, at bedside.

## 2017-08-14 NOTE — ED Provider Notes (Signed)
Patient presented to the ER with abdominal pain.  Symptoms ongoing for 1-2 days.  Pain is diffuse in nature, has had some mild nausea.  No diarrhea or constipation.  Face to face Exam: HEENT - PERRLA Lungs - CTAB Heart - RRR, no M/R/G Abd -no distention, diffuse tenderness, no guarding or rebound Neuro - alert, oriented x3  Plan: Will obtain CT scan to further evaluate.   [name], [name], MD 08/14/17 [phone]

## 2017-08-14 NOTE — Anesthesia Procedure Notes (Addendum)
Procedure Name: Intubation Date/Time: 08/14/2017 7:51 AM Performed by: [place], Raiyan Dalesandro C, CRNA Pre-anesthesia Checklist: Patient identified, Emergency Drugs available, Suction available and Patient being monitored Patient Re-evaluated:Patient Re-evaluated prior to induction Oxygen Delivery Method: Circle system utilized Preoxygenation: Pre-oxygenation with 100% oxygen Induction Type: IV induction, Cricoid Pressure applied and Rapid sequence Ventilation: Mask ventilation without difficulty Laryngoscope Size: Miller and 2 Grade View: Grade I Tube type: Oral Tube size: 7.0 mm Number of attempts: 1 Airway Equipment and Method: Stylet and Oral airway Placement Confirmation: ETT inserted through vocal cords under direct vision,  positive ETCO2 and breath sounds checked- equal and bilateral Tube secured with: Tape Dental Injury: Teeth and Oropharynx as per pre-operative assessment  Comments: Intubation by Dr. Krista BlueSinger

## 2017-08-14 NOTE — Anesthesia Preprocedure Evaluation (Addendum)
Anesthesia Evaluation  Patient identified by MRN, date of birth, ID band Patient awake    Reviewed: Allergy & Precautions, NPO status , Patient's Chart, lab work & pertinent test results  History of Anesthesia Complications Negative for: history of anesthetic complications  Airway Mallampati: II  TM Distance: >3 FB Neck ROM: Full    Dental no notable dental hx. (+) Dental Advisory Given   Pulmonary neg pulmonary ROS,    Pulmonary exam normal        Cardiovascular hypertension, Normal cardiovascular exam     Neuro/Psych negative neurological ROS  negative psych ROS   GI/Hepatic Neg liver ROS,   Endo/Other  Hypothyroidism   Renal/GU negative Renal ROS  negative genitourinary   Musculoskeletal negative musculoskeletal ROS (+)   Abdominal   Peds negative pediatric ROS (+)  Hematology negative hematology ROS (+)   Anesthesia Other Findings   Reproductive/Obstetrics negative OB ROS                            Anesthesia Physical Anesthesia Plan  ASA: III  Anesthesia Plan: General   Post-op Pain Management:    Induction: Intravenous, Rapid sequence and Cricoid pressure planned  PONV Risk Score and Plan: 4 or greater and Ondansetron, Dexamethasone, Scopolamine patch - Pre-op and Diphenhydramine  Airway Management Planned: Oral ETT  Additional Equipment:   Intra-op Plan:   Post-operative Plan: Extubation in OR  Informed Consent: I have reviewed the patients History and Physical, chart, labs and discussed the procedure including the risks, benefits and alternatives for the proposed anesthesia with the patient or authorized representative who has indicated his/her understanding and acceptance.   Dental advisory given  Plan Discussed with: CRNA and Anesthesiologist  Anesthesia Plan Comments:        Anesthesia Quick Evaluation

## 2017-08-14 NOTE — Transfer of Care (Signed)
Immediate Anesthesia Transfer of Care Note  Patient: Miranda NakayamaWinnie Randall  Procedure(s) Performed: APPENDECTOMY LAPAROSCOPIC possible exploratory laparotomy (N/A )  Patient Location: PACU  Anesthesia Type:General  Level of Consciousness: awake, alert  and oriented  Airway & Oxygen Therapy: Patient Spontanous Breathing and Patient connected to face mask oxygen  Post-op Assessment: Report given to RN and Post -op Vital signs reviewed and stable  Post vital signs: Reviewed and stable  Last Vitals:  Vitals:   08/14/17 0625 08/14/17 0938  BP: (!) 103/53 (!) 93/56  Pulse: 69 89  Resp: 18   Temp:    SpO2: 90% 92%    Last Pain:  Vitals:   08/14/17 0725  TempSrc:   PainSc: 2       Patients Stated Pain Goal: 4 (08/14/17 0725)  Complications: No apparent anesthesia complications

## 2017-08-14 NOTE — Anesthesia Postprocedure Evaluation (Signed)
Anesthesia Post Note  Patient: Miranda Randall  Procedure(s) Performed: APPENDECTOMY LAPAROSCOPIC possible exploratory laparotomy (N/A )     Patient location during evaluation: PACU Anesthesia Type: General Level of consciousness: sedated Pain management: pain level controlled Vital Signs Assessment: post-procedure vital signs reviewed and stable Respiratory status: spontaneous breathing and respiratory function stable Cardiovascular status: stable Postop Assessment: no apparent nausea or vomiting Anesthetic complications: no    Last Vitals:  Vitals:   08/14/17 1015 08/14/17 1030  BP: 100/78 102/71  Pulse: 81 75  Resp: 19 18  Temp:  36.8 C  SpO2: 94% 95%    Last Pain:  Vitals:   08/14/17 0725  TempSrc:   PainSc: 2                  Davionne Dowty DANIEL

## 2017-08-15 LAB — CBC
HCT: 35.1 % — ABNORMAL LOW (ref 36.0–46.0)
Hemoglobin: 11.8 g/dL — ABNORMAL LOW (ref 12.0–15.0)
MCH: 30.3 pg (ref 26.0–34.0)
MCHC: 33.6 g/dL (ref 30.0–36.0)
MCV: 90 fL (ref 78.0–100.0)
Platelets: 136 10*3/uL — ABNORMAL LOW (ref 150–400)
RBC: 3.9 MIL/uL (ref 3.87–5.11)
RDW: 13.6 % (ref 11.5–15.5)
WBC: 10.3 10*3/uL (ref 4.0–10.5)

## 2017-08-15 LAB — BASIC METABOLIC PANEL
ANION GAP: 4 — AB (ref 5–15)
BUN: 14 mg/dL (ref 6–20)
CALCIUM: 7.8 mg/dL — AB (ref 8.9–10.3)
CO2: 24 mmol/L (ref 22–32)
Chloride: 110 mmol/L (ref 101–111)
Creatinine, Ser: 0.67 mg/dL (ref 0.44–1.00)
GFR calc non Af Amer: >60 mL/min (ref >60)
GLUCOSE: 104 mg/dL — AB (ref 65–99)
POTASSIUM: 3.5 mmol/L (ref 3.5–5.1)
Sodium: 138 mmol/L (ref 135–145)

## 2017-08-15 NOTE — Progress Notes (Signed)
Patient ID: Miranda NakayamaWinnie Randall, female   DOB: 11/14/1945, 71 y.o.   MRN: [id number] 1 Day Post-Op   Subjective: Feels much better than preoperatively and better than yesterday.  Just sore.  No nausea.  That getting up to the bathroom and voiding.  Objective: Vital signs in last 24 hours: Temp:  [97.9 F (36.6 C)-99.9 F (37.7 C)] 98 F (36.7 C) (11/24 0540) Pulse Rate:  [62-89] 62 (11/24 0540) Resp:  [12-19] 16 (11/24 0540) BP: (85-119)/(45-78) 119/60 (11/24 0540) SpO2:  [91 %-100 %] 100 % (11/24 0540) Last BM Date: 08/12/17  Intake/Output from previous day: 11/23 0701 - 11/24 0700 In: 2425 [I.V.:2425] Out: 2575 [Urine:2550; Blood:25] Intake/Output this shift: No intake/output data recorded.  General appearance: alert, cooperative and no distress GI: Mild right lower quadrant tenderness without guarding.  No distention. Incision/Wound: No erythema or drainage  Lab Results:  Recent Labs    08/14/17 0055 08/15/17 0412  WBC 5.7 10.3  HGB 13.7 11.8*  HCT 40.5 35.1*  PLT 159 136*   BMET Recent Labs    08/14/17 0055 08/15/17 0412  NA 130* 138  K 3.6 3.5  CL 96* 110  CO2 23 24  GLUCOSE 125* 104*  BUN 18 14  CREATININE 0.81 0.67  CALCIUM 8.4* 7.8*     Studies/Results: Ct Abdomen Pelvis W Contrast  Result Date: 08/14/2017 CLINICAL DATA:  Lower abdominal pain. Romain lettuce ingestion 08/11/2017, 3 days prior. EXAM: CT ABDOMEN AND PELVIS WITH CONTRAST TECHNIQUE: Multidetector CT imaging of the abdomen and pelvis was performed using the standard protocol following bolus administration of intravenous contrast. CONTRAST:  100 cc Isovue-300 IV COMPARISON:  None. FINDINGS: Lower chest: Small right pleural effusion and lower lobe atelectasis. Streaky lingular and left lower lobe atelectasis. Hepatobiliary: No focal hepatic lesion. Gallbladder is physiologically distended without calcified gallstone. Small amount stranding about the gallbladder fundus is felt be tracking from  right lower quadrant inflammatory process. Pancreas: No ductal dilatation or inflammation. Spleen: Normal in size without focal abnormality. Adrenals/Urinary Tract: No adrenal nodule. Small low-density lesions throughout both kidneys, majority are subcentimeter and too small to characterized, larger lesion in the lower right kidney is consistent with simple cyst. Urinary bladder is physiologically distended. Stomach/Bowel: Stomach is nondistended. Proximal small bowel is decompressed. Pelvic small bowel loops are fluid-filled with wall thickening and mild mucosal enhancement. Dilated blind-ending tubular structure in the right lower quadrant consistent with dilated appendix. Significant right lower quadrant inflammatory change. Cecum is located into the right mid abdomen. Significant colonic tortuosity. Moderate sigmoid diverticulosis without diverticulitis. Appendix: Location: Retrocecal, however cecum located in the mid abdomen. Diameter: 16 mm. Appendicolith: Yes, at least 3, largest measuring 8 mm at the base. Mucosal hyper-enhancement: Yes. Extraluminal gas: No. Periappendiceal collection: Free fluid in the right lower quadrant and right pericolic gutter, with possible organizing collection in the pericolic gutter, for example image 50 series 2. Vascular/Lymphatic: Tortuous abdominal aorta which trace atherosclerosis. No enlarged abdominal or pelvic lymph nodes. Reproductive: Multi septated versus 2 adjacent cysts in the left ovary, with cystic components measuring 13 and 19 mm. Right ovary not definitively seen. Probable exophytic partially calcified uterine fibroid on the left. Endometrial thickening or fluid measuring 12 mm in the fundus. Other: Significant inflammatory change in the pelvis and right lower quadrant with soft tissue stranding and free fluid. Mesenteric edema and free fluid. Free fluid tracks in both pericolic gutters, left greater than right, with possible ill-defined organizing collection in  the right lower quadrant. No free  air. Musculoskeletal: Scoliosis and degenerative change in the spine. There are no acute or suspicious osseous abnormalities. IMPRESSION: 1. Findings consistent with acute appendicitis. Significantly inflamed appendix containing multiple appendicoliths. The degree of inflammation, free fluid and probable developing organizing fluid collection in the right pericolic gutter are suspicious for rupture. No extraluminal air. Cecum is located in the right mid abdomen partially distorting conventional anatomy. 2. Thickened pelvic bowel loops with mucosal enhancement likely reactive secondary to the right lower quadrant inflammatory process. 3. Colonic diverticulosis without diverticulitis. 4. Left ovarian cysts versus septated cyst. If 2 adjacent cysts, cysts measures 19 and 13 mm respectively. These are probably benign, however recommend sonographic characterization on a nonemergent basis after coalescence of acute event. Additionally there is endometrial thickening or fluid of 12 mm, abnormal in a postmenopausal patient. This should also be assessed with ultrasound. Electronically Signed   By: Rubye OaksMelanie  Ehinger M.D.   On: 08/14/2017 04:04    Anti-infectives: Anti-infectives (From admission, onward)   Start     Dose/Rate Route Frequency Ordered Stop   08/14/17 1115  piperacillin-tazobactam (ZOSYN) IVPB 3.375 g     3.375 g 12.5 mL/hr over 240 Minutes Intravenous Every 8 hours 08/14/17 1102     08/14/17 0445  piperacillin-tazobactam (ZOSYN) IVPB 3.375 g     3.375 g 100 mL/hr over 30 Minutes Intravenous  Once 08/14/17 0433 08/14/17 0543      Assessment/Plan: Perforated appendicitis with diffuse peritonitis s/p Procedure(s): APPENDECTOMY LAPAROSCOPIC  Doing very well postoperatively.  Continue IV antibiotics.  Start clear liquid diet.  Ambulation encouraged.    LOS: 1 day    Mariella SaaBenjamin T Chistine Dematteo 08/15/2017

## 2017-08-16 LAB — CBC
HCT: 39.1 % (ref 36.0–46.0)
Hemoglobin: 13.1 g/dL (ref 12.0–15.0)
MCH: 30.3 pg (ref 26.0–34.0)
MCHC: 33.5 g/dL (ref 30.0–36.0)
MCV: 90.5 fL (ref 78.0–100.0)
PLATELETS: 146 10*3/uL — AB (ref 150–400)
RBC: 4.32 MIL/uL (ref 3.87–5.11)
RDW: 13.7 % (ref 11.5–15.5)
WBC: 15.2 10*3/uL — ABNORMAL HIGH (ref 4.0–10.5)

## 2017-08-16 LAB — BASIC METABOLIC PANEL
Anion gap: 8 (ref 5–15)
BUN: 21 mg/dL — AB (ref 6–20)
CHLORIDE: 106 mmol/L (ref 101–111)
CO2: 26 mmol/L (ref 22–32)
Calcium: 8.5 mg/dL — ABNORMAL LOW (ref 8.9–10.3)
Creatinine, Ser: 0.8 mg/dL (ref 0.44–1.00)
GFR calc Af Amer: >60 mL/min (ref >60)
GLUCOSE: 80 mg/dL (ref 65–99)
POTASSIUM: 4.2 mmol/L (ref 3.5–5.1)
Sodium: 140 mmol/L (ref 135–145)

## 2017-08-16 MED ORDER — LIP MEDEX EX OINT
TOPICAL_OINTMENT | CUTANEOUS | Status: AC
  Administered 2017-08-16: 13:00:00
  Filled 2017-08-16: qty 7

## 2017-08-16 NOTE — Progress Notes (Signed)
Patient ID: Miranda NakayamaWinnie Randall, female   DOB: 02/25/1946, 71 y.o.   MRN: [id number] 2 Days Post-Op   Subjective: No complaints this morning.  Mild discomfort with motion only.  Has had flatus and a small bowel movement.  No nausea but no appetite.  Objective: Vital signs in last 24 hours: Temp:  [97.9 F (36.6 C)-98.4 F (36.9 C)] 98.2 F (36.8 C) (11/25 0514) Pulse Rate:  [70-72] 70 (11/25 0514) Resp:  [18] 18 (11/25 0514) BP: (113-138)/(61-72) 138/72 (11/25 0514) SpO2:  [91 %-96 %] 91 % (11/25 0514) Last BM Date: 08/12/17  Intake/Output from previous day: 11/24 0701 - 11/25 0700 In: 2955 [P.O.:480; I.V.:2275; IV Piggyback:200] Out: 1490 [Urine:1490] Intake/Output this shift: No intake/output data recorded.  General appearance: alert, cooperative and no distress GI: normal findings: soft, non-tender and Nondistended Incision/Wound: Drainage  Lab Results:  Recent Labs    08/15/17 0412 08/16/17 0403  WBC 10.3 15.2*  HGB 11.8* 13.1  HCT 35.1* 39.1  PLT 136* 146*   BMET Recent Labs    08/15/17 0412 08/16/17 0403  NA 138 140  K 3.5 4.2  CL 110 106  CO2 24 26  GLUCOSE 104* 80  BUN 14 21*  CREATININE 0.67 0.80  CALCIUM 7.8* 8.5*     Studies/Results: No results found.  Anti-infectives: Anti-infectives (From admission, onward)   Start     Dose/Rate Route Frequency Ordered Stop   08/14/17 1115  piperacillin-tazobactam (ZOSYN) IVPB 3.375 g     3.375 g 12.5 mL/hr over 240 Minutes Intravenous Every 8 hours 08/14/17 1102     08/14/17 0445  piperacillin-tazobactam (ZOSYN) IVPB 3.375 g     3.375 g 100 mL/hr over 30 Minutes Intravenous  Once 08/14/17 0433 08/14/17 0543      Assessment/Plan: s/p Procedure(s): APPENDECTOMY LAPAROSCOPIC possible exploratory laparotomy Doing well postoperatively without apparent complication.  Bowel function starting. Some leukocytosis today which is probably appropriate in this setting.  Continue IV Zosyn.  Check CBC in a.m. Advance  to full liquid diet   LOS: 2 days    Mariella SaaBenjamin T Albertus Chiarelli 08/16/2017

## 2017-08-17 LAB — CBC
HEMATOCRIT: 37.2 % (ref 36.0–46.0)
Hemoglobin: 12.4 g/dL (ref 12.0–15.0)
MCH: 29.8 pg (ref 26.0–34.0)
MCHC: 33.3 g/dL (ref 30.0–36.0)
MCV: 89.4 fL (ref 78.0–100.0)
PLATELETS: 190 10*3/uL (ref 150–400)
RBC: 4.16 MIL/uL (ref 3.87–5.11)
RDW: 13.6 % (ref 11.5–15.5)
WBC: 15.5 10*3/uL — AB (ref 4.0–10.5)

## 2017-08-17 MED ORDER — MORPHINE SULFATE (PF) 2 MG/ML IV SOLN
2.0000 mg | INTRAVENOUS | Status: DC | PRN
  Administered 2017-08-17: 2 mg via INTRAVENOUS
  Filled 2017-08-17: qty 1

## 2017-08-17 MED ORDER — BOOST / RESOURCE BREEZE PO LIQD
1.0000 | Freq: Three times a day (TID) | ORAL | Status: DC
  Administered 2017-08-17 – 2017-08-18 (×3): 1 via ORAL

## 2017-08-17 MED ORDER — OXYCODONE HCL 5 MG PO TABS
5.0000 mg | ORAL_TABLET | ORAL | Status: DC | PRN
  Administered 2017-08-19: 5 mg via ORAL
  Filled 2017-08-17: qty 1

## 2017-08-17 NOTE — Care Management Important Message (Signed)
Important Message  Patient Details  Name: Miranda NakayamaWinnie Pendry MRN: [id number] Date of Birth: 09/17/1946   Medicare Important Message Given:  Yes    Caren MacadamFuller, Marisa Hufstetler 08/17/2017, 10:53 AMImportant Message  Patient Details  Name: Miranda NakayamaWinnie Biscardi MRN: [id number] Date of Birth: 01/13/1946   Medicare Important Message Given:  Yes    Caren MacadamFuller, Ivon Oelkers 08/17/2017, 10:53 AM

## 2017-08-17 NOTE — Progress Notes (Signed)
Patient ID: Miranda NakayamaWinnie Randall, female   DOB: 01/31/1946, 71 y.o.   MRN: [id number]  [facility] Progress Note  3 Days Post-Op  Subjective: CC-  Patient states that she vomited this morning. Appetite suppressed but she is tolerating liquids. Abdominal pain well controlled. She is passing flatus and had a good BM yesterday, but still feels distended.  WBC 15.5, TMAX 99. Denies dysuria, CP, cough. She does report mild SOB but thinks that it is due to her abdomen being distended. SOB worse with sitting down, improves with standing up. Pulling 1100 on IS. Ambulated about 9 laps yesterday.  Lives independently in a house in her son's backyard. [place]  Objective: Vital signs in last 24 hours: Temp:  [98.3 F (36.8 C)-99 F (37.2 C)] 98.8 F (37.1 C) (11/26 0520) Pulse Rate:  [61-67] 61 (11/26 0520) Resp:  [16] 16 (11/26 0520) BP: (134-148)/(64-74) 141/64 (11/26 0520) SpO2:  [91 %-94 %] 92 % (11/26 0520) Last BM Date: 08/12/17  Intake/Output from previous day: 11/25 0701 - 11/26 0700 In: 1900 [P.O.:600; I.V.:1200; IV Piggyback:100] Out: 1300 [Urine:1100; Emesis/NG output:200] Intake/Output this shift: Total I/O In: -  Out: 200 [Emesis/NG output:200]  PE: Gen:  Alert, NAD, pleasant HEENT: EOM's intact, pupils equal and round Card:  RRR, no M/G/R heard Pulm:  CTAB, no W/R/R, effort normal Abd: Soft, distended, nontender, +BS, no HSM, multiple lap incisions C/D/I Ext: no calf tenderness Psych: A&Ox3  Skin: no rashes noted, warm and dry  Lab Results:  Recent Labs    08/16/17 0403 08/17/17 0535  WBC 15.2* 15.5*  HGB 13.1 12.4  HCT 39.1 37.2  PLT 146* 190   BMET Recent Labs    08/15/17 0412 08/16/17 0403  NA 138 140  K 3.5 4.2  CL 110 106  CO2 24 26  GLUCOSE 104* 80  BUN 14 21*  CREATININE 0.67 0.80  CALCIUM 7.8* 8.5*   PT/INR No results for input(s): LABPROT, INR in the last 72 hours. CMP     Component Value Date/Time   NA 140 08/16/2017  0403   K 4.2 08/16/2017 0403   CL 106 08/16/2017 0403   CO2 26 08/16/2017 0403   GLUCOSE 80 08/16/2017 0403   BUN 21 (H) 08/16/2017 0403   CREATININE 0.80 08/16/2017 0403   CALCIUM 8.5 (L) 08/16/2017 0403   PROT 6.0 (L) 08/14/2017 0055   ALBUMIN 3.4 (L) 08/14/2017 0055   AST 26 08/14/2017 0055   ALT 17 08/14/2017 0055   ALKPHOS 86 08/14/2017 0055   BILITOT 1.0 08/14/2017 0055   GFRNONAA >60 08/16/2017 0403   GFRAA >60 08/16/2017 0403   Lipase     Component Value Date/Time   LIPASE 16 08/14/2017 0055       Studies/Results: No results found.  Anti-infectives: Anti-infectives (From admission, onward)   Start     Dose/Rate Route Frequency Ordered Stop   08/14/17 1115  piperacillin-tazobactam (ZOSYN) IVPB 3.375 g     3.375 g 12.5 mL/hr over 240 Minutes Intravenous Every 8 hours 08/14/17 1102     08/14/17 0445  piperacillin-tazobactam (ZOSYN) IVPB 3.375 g     3.375 g 100 mL/hr over 30 Minutes Intravenous  Once 08/14/17 0433 08/14/17 0543       Assessment/Plan HTN Hypothyroidism  Perforated appendicitis S/p laparoscopic appendectomy 08/14/17 Dr. Johna SheriffHoxworth - POD 3 - WBC 15.5 today from 15.2, TMAX 99 - postop ileus  ID - zosyn 11/23>>day#4 FEN - IVF, FLD, add Boost VTE - SCDs Foley -  none Follow up - [facility] clinic 2-3 weeks  Plan - Continue IV antibiotics. Continue full liquids, add Boost. Encourage ambulation/IS today. Labs in AM.   LOS: 3 days    Franne FortsBrooke A Meuth , [facility] 08/17/2017, 9:36 AM Pager: [phone] Consults: [phone] Mon-Fri 7:00 am-4:30 pm Sat-Sun 7:00 am-11:30 am

## 2017-08-17 NOTE — Discharge Instructions (Addendum)
Please arrive at least 30 min before your appointment to complete your check in paperwork.  If you are unable to arrive 30 min prior to your appointment time we may have to cancel or reschedule you. ° °LAPAROSCOPIC SURGERY: POST OP INSTRUCTIONS  °1. DIET: Follow a light bland diet the first 24 hours after arrival home, such as soup, liquids, crackers, etc. Be sure to include lots of fluids daily. Avoid fast food or heavy meals as your are more likely to get nauseated. Eat a low fat the next few days after surgery.  °2. Take your usually prescribed home medications unless otherwise directed. °3. PAIN CONTROL:  °1. Pain is best controlled by a usual combination of three different methods TOGETHER:  °1. Ice/Heat °2. Over the counter pain medication °3. Prescription pain medication °2. Most patients will experience some swelling and bruising around the incisions. Ice packs or heating pads (30-60 minutes up to 6 times a day) will help. Use ice for the first few days to help decrease swelling and bruising, then switch to heat to help relax tight/sore spots and speed recovery. Some people prefer to use ice alone, heat alone, alternating between ice & heat. Experiment to what works for you. Swelling and bruising can take several weeks to resolve.  °3. It is helpful to take an over-the-counter pain medication regularly for the first few weeks. Choose one of the following that works best for you:  °1. Naproxen (Aleve, etc) Two 220mg tabs twice a day °2. Ibuprofen (Advil, etc) Three 200mg tabs four times a day (every meal & bedtime) °3. Acetaminophen (Tylenol, etc) 500-650mg four times a day (every meal & bedtime) °4. A prescription for pain medication (such as oxycodone, hydrocodone, etc) should be given to you upon discharge. Take your pain medication as prescribed.  °1. If you are having problems/concerns with the prescription medicine (does not control pain, nausea, vomiting, rash, itching, etc), please call us [phone] to see if we need to switch you to a different pain medicine that will work better for you and/or control your side effect better. °2. If you need a refill on your pain medication, please contact your pharmacy. They will contact our office to request authorization. Prescriptions will not be filled after 5 pm or on week-ends. °4. Avoid getting constipated. Between the surgery and the pain medications, it is common to experience some constipation. Increasing fluid intake and taking a fiber supplement (such as Metamucil, Citrucel, FiberCon, MiraLax, etc) 1-2 times a day regularly will usually help prevent this problem from occurring. A mild laxative (prune juice, Milk of Magnesia, MiraLax, etc) should be taken according to package directions if there are no bowel movements after 48 hours.  °5. Watch out for diarrhea. If you have many loose bowel movements, simplify your diet to bland foods & liquids for a few days. Stop any stool softeners and decrease your fiber supplement. Switching to mild anti-diarrheal medications (Kayopectate, Pepto Bismol) can help. If this worsens or does not improve, please call us. °6. Wash / shower every day. You may shower over the dressings as they are waterproof. Continue to shower over incision(s) after the dressing is off. If there is glue over the incisions try not to pick it off, let it fall off naturally. °7. Remove your waterproof bandages 2 days after surgery. You may leave the incision open to air. You may replace a dressing/Band-Aid to cover the incision for comfort if you wish.  °8. ACTIVITIES as tolerated:  °  1. You may resume regular (light) daily activities beginning the next day--such as daily self-care, walking, climbing stairs--gradually increasing activities as tolerated. If you can walk 30 minutes without difficulty, it is safe to try more intense activity such as jogging, treadmill, bicycling, low-impact aerobics, swimming, etc. 2. Save the most intensive and  strenuous activity for last such as sit-ups, heavy lifting, contact sports, etc Refrain from any heavy lifting or straining until you are off narcotics for pain control. For the first 2-3 weeks do not lift over 10-15lb.  3. DO NOT PUSH THROUGH PAIN. Let pain be your guide: If it hurts to do something, don't do it. Pain is your body warning you to avoid that activity for another week until the pain goes down. 4. You may drive when you are no longer taking prescription pain medication, you can comfortably wear a seatbelt, and you can safely maneuver your car and apply brakes. 5. You may have sexual intercourse when it is comfortable.  9. FOLLOW UP in our office  1. Please call [facility] at [phone] to set up an appointment to see your surgeon in the office for a follow-up appointment approximately 2-3 weeks after your surgery. 2. Make sure that you call for this appointment the day you arrive home to insure a convenient appointment time.      10. IF YOU HAVE DISABILITY OR FAMILY LEAVE FORMS, BRING THEM TO THE               OFFICE FOR PROCESSING.   WHEN TO CALL US [phone]:  1. Poor pain control 2. Reactions / problems with new medications (rash/itching, nausea, etc)  3. Fever over 101.5 F (38.5 C) 4. Inability to urinate 5. Nausea and/or vomiting 6. Worsening swelling or bruising 7. Continued bleeding from incision. 8. Increased pain, redness, or drainage from the incision  The clinic staff is available to answer your questions during regular business hours (8:30am-5pm). Please dont hesitate to call and ask to speak to one of our nurses for clinical concerns.  If you have a medical emergency, go to the nearest emergency room or call 911.  A surgeon from [facility] is always on call at the [facility]   [facility] [place]  [address], [address], [place], [place] [address] ?  MAIN: [phone] ? TOLL FREE: [phone] ?  FAX [phone]    [email or web address]     Laparoscopic Appendectomy, Adult, Care After Refer to this sheet in the next few weeks. These instructions provide you with information about caring for yourself after your procedure. Your health care provider may also give you more specific instructions. Your treatment has been planned according to current medical practices, but problems sometimes occur. Call your health care provider if you have any problems or questions after your procedure. What can I expect after the procedure? After the procedure, it is common to have:  A decrease in your energy level.  Mild pain in the area where the surgical cuts (incisions) were made.  Constipation. This can be caused by pain medicine and a decrease in your activity.  Follow these instructions at home: Medicines  Take over-the-counter and prescription medicines only as told by your health care provider.  Do not drive for 24 hours if you received a sedative.  Do not drive or operate heavy machinery while taking prescription pain medicine.  If you were prescribed an antibiotic medicine, take it as told by your health care provider. Do  not stop taking the antibiotic even if you start to feel better. Activity  For 3 weeks or as long as told by your health care provider: ? Do not lift anything that is heavier than 10 pounds (4.5 kg). ? Do not play contact sports.  Gradually return to your normal activities. Ask your health care provider what activities are safe for you. Bathing  Keep your incisions clean and dry. Clean them as often as told by your health care provider: ? Gently wash the incisions with soap and water. ? Rinse the incisions with water to remove all soap. ? Pat the incisions dry with a clean towel. Do not rub the incisions.  You may take showers after 48 hours.  Do not take baths, swim, or use hot tubs for 2 weeks or as told by your health care provider. Incision care  Follow  instructions from your healthcare provider about how to take care of your incisions. Make sure you: ? Wash your hands with soap and water before you change your bandage (dressing). If soap and water are not available, use hand sanitizer. ? Change your dressing as told by your health care provider. ? Leave stitches (sutures), skin glue, or adhesive strips in place. These skin closures may need to stay in place for 2 weeks or longer. If adhesive strip edges start to loosen and curl up, you may trim the loose edges. Do not remove adhesive strips completely unless your health care provider tells you to do that.  Check your incision areas every day for signs of infection. Check for: ? More redness, swelling, or pain. ? More fluid or blood. ? Warmth. ? Pus or a bad smell. Other Instructions  If you were sent home with a drain, follow instructions from your health care provider about how to care for the drain and how to empty it.  Take deep breaths. This helps to prevent your lungs from becoming inflamed.  To relieve and prevent constipation: ? Drink plenty of fluids. ? Eat plenty of fruits and vegetables.  Keep all follow-up visits as told by your health care provider. This is important. Contact a health care provider if:  You have more redness, swelling, or pain around an incision.  You have more fluid or blood coming from an incision.  Your incision feels warm to the touch.  You have pus or a bad smell coming from an incision or dressing.  Your incision edges break open after your sutures have been removed.  You have increasing pain in your shoulders.  You feel dizzy or you faint.  You develop shortness of breath.  You keep feeling nauseous or vomiting.  You have diarrhea or you cannot control your bowel functions.  You lose your appetite.  You develop swelling or pain in your legs. Get help right away if:  You have a fever.  You develop a rash.  You have difficulty  breathing.  You have sharp pains in your chest. This information is not intended to replace advice given to you by your health care provider. Make sure you discuss any questions you have with your health care provider. Document Released: 09/08/2005 Document Revised: 02/08/2016 Document Reviewed: 02/26/2015 Elsevier Interactive Patient Education  2017 [employer]

## 2017-08-18 LAB — BASIC METABOLIC PANEL
ANION GAP: 8 (ref 5–15)
BUN: 19 mg/dL (ref 6–20)
CO2: 27 mmol/L (ref 22–32)
CREATININE: 0.57 mg/dL (ref 0.44–1.00)
Calcium: 8.1 mg/dL — ABNORMAL LOW (ref 8.9–10.3)
Chloride: 105 mmol/L (ref 101–111)
GFR calc Af Amer: >60 mL/min (ref >60)
GFR calc non Af Amer: >60 mL/min (ref >60)
Glucose, Bld: 111 mg/dL — ABNORMAL HIGH (ref 65–99)
Potassium: 3.3 mmol/L — ABNORMAL LOW (ref 3.5–5.1)
Sodium: 140 mmol/L (ref 135–145)

## 2017-08-18 LAB — CBC
HEMATOCRIT: 34.3 % — AB (ref 36.0–46.0)
Hemoglobin: 11.3 g/dL — ABNORMAL LOW (ref 12.0–15.0)
MCH: 29.5 pg (ref 26.0–34.0)
MCHC: 32.9 g/dL (ref 30.0–36.0)
MCV: 89.6 fL (ref 78.0–100.0)
PLATELETS: 193 10*3/uL (ref 150–400)
RBC: 3.83 MIL/uL — ABNORMAL LOW (ref 3.87–5.11)
RDW: 13.6 % (ref 11.5–15.5)
WBC: 11 10*3/uL — AB (ref 4.0–10.5)

## 2017-08-18 LAB — MAGNESIUM: Magnesium: 1.8 mg/dL (ref 1.7–2.4)

## 2017-08-18 MED ORDER — IBUPROFEN 200 MG PO TABS
600.0000 mg | ORAL_TABLET | Freq: Four times a day (QID) | ORAL | Status: DC | PRN
  Administered 2017-08-18 – 2017-08-19 (×2): 600 mg via ORAL
  Filled 2017-08-18 (×2): qty 3

## 2017-08-18 MED ORDER — POTASSIUM CHLORIDE CRYS ER 20 MEQ PO TBCR
20.0000 meq | EXTENDED_RELEASE_TABLET | Freq: Two times a day (BID) | ORAL | Status: DC
  Administered 2017-08-18 – 2017-08-19 (×3): 20 meq via ORAL
  Filled 2017-08-18 (×4): qty 1

## 2017-08-18 MED ORDER — SACCHAROMYCES BOULARDII 250 MG PO CAPS
250.0000 mg | ORAL_CAPSULE | Freq: Two times a day (BID) | ORAL | Status: DC
  Administered 2017-08-18 – 2017-08-19 (×3): 250 mg via ORAL
  Filled 2017-08-18 (×3): qty 1

## 2017-08-18 NOTE — Progress Notes (Signed)
4 Days Post-Op    CC: Abdominal pain  Subjective: Sitting up in bed very comfortable.  She still thinks her stomach is rather protuberant, she is having some intermittent nausea but this seems to be improving.  Tolerating clear liquids well.  She is ambulating quite a bit and is comfortable moving in bed.  She is passing flatus and has had a bowel movement.  Objective: Vital signs in last 24 hours: Temp:  [98.1 F (36.7 C)-98.4 F (36.9 C)] 98.1 F (36.7 C) (11/27 0600) Pulse Rate:  [60-76] 76 (11/27 0600) Resp:  [17-18] 18 (11/27 0600) BP: (128-146)/(65-73) 140/65 (11/27 0600) SpO2:  [92 %-95 %] 92 % (11/27 0600) Last BM Date: 08/12/17 450 IV P.o. not recorded Urine 300 recorded Emesis to 275 recorded Afebrile vital signs are stable K+ 3.3  WBC down to 11.0, hemoglobin hematocrit stable CT 08/14/17: Acute appendicitis suspicious for perforated appendix.  Thickened pelvic bowel loops likely secondary to right lower quadrant inflammatory process.  Colonic diverticulosis without diverticulitis.  Left ovarian cysts/endometrial thickening which was abnormal in a postmenopausal patient.   intake/Output from previous day: 11/26 0701 - 11/27 0700 In: 450 [I.V.:400; IV Piggyback:50] Out: 575 [Urine:300; Emesis/NG output:275] Intake/Output this shift: No intake/output data recorded.  General appearance: alert, cooperative and no distress Resp: clear to auscultation bilaterally GI: Soft, normal postop soreness.  Port sites look good.  Positive bowel sounds but hypoactive.  Positive BM.  Lab Results:  Recent Labs    08/17/17 0535 08/18/17 0515  WBC 15.5* 11.0*  HGB 12.4 11.3*  HCT 37.2 34.3*  PLT 190 193    BMET Recent Labs    08/16/17 0403 08/18/17 0515  NA 140 140  K 4.2 3.3*  CL 106 105  CO2 26 27  GLUCOSE 80 111*  BUN 21* 19  CREATININE 0.80 0.57  CALCIUM 8.5* 8.1*   PT/INR No results for input(s): LABPROT, INR in the last 72 hours.  Recent Labs  Lab  08/14/17 0055  AST 26  ALT 17  ALKPHOS 86  BILITOT 1.0  PROT 6.0*  ALBUMIN 3.4*     Lipase     Component Value Date/Time   LIPASE 16 08/14/2017 0055     Medications: . enoxaparin (LOVENOX) injection  40 mg Subcutaneous Q24H  . feeding supplement  1 Container Oral TID BM  . levothyroxine  75 mcg Oral QAC breakfast  . losartan  12.5 mg Oral Daily    Assessment/Plan Perforated appendicitis with diffuse peritonitis Laparoscopic appendectomy, 08/14/17 Dr. Glenna FellowsBenjamin Hoxworth   POD# 4 WBC improving - saline lock IV - soft diet  Hypokalemia - check Mag and replace K+ PO Endometrial thickening on CT scan, 08/14/17 Hypertension Hypothyroid  ID - zosyn 11/23 >> day 5 FEN - IVF, Full liquids VTE - SCDs/Lovenox Foley - none Follow up - [facility] clinic 2-3 weeks   Plan: I will advance her diet to a soft and get a put her on some Pepcid, if she continues to do well aim for discharge in the next 24-48 hours.  LOS: 4 days    Roxene Alviar 08/18/2017 [phone]

## 2017-08-18 NOTE — Discharge Summary (Signed)
Physician Discharge Summary  Patient ID: Miranda NakayamaWinnie Plaskett MRN: [id number] DOB/AGE: 72/11/1945 72 y.o.  Admit date: 08/14/2017 Discharge date: 08/19/2017  Admission Diagnoses:  Perforated appendicitis with diffuse peritonitis Endometrial thickening on CT scan, 08/14/17 Hypertension Hypothyroid   Discharge Diagnoses:  Same   Active Problems:   Perforated appendicitis   PROCEDURES: Laparoscopic appendectomy, [facility]/14[facility]17 [facility]      Hospital Course: 48 hours ago developed the gradual onset of aching epigastric pain.  This was followed soon after by nausea and vomiting.  She rested in bed all day thinking she might have food poisoning.  She did have some diarrhea as well.  Over the course of the day the pain gradually worsened and began to move into her lower abdomen.  Yesterday the pain persisted and her lower abdomen.  She describes aching pain across her lower abdomen greater in the right than the left.  She had continued nausea.  No urinary symptoms.  The pain gradually intensified and she presented to the emergency room late last night/early this morning.  Denies any chronic GI symptoms or similar previous illnesses.  Pt was seen in the ED and admitted with possible perforation and extensive inflammatory process from her appendix.  She was taken to the OR that morning and underwent above noted procedure.  She did well, she was started on clear liquids the first post op day.  As her bowel function returned her diet was advanced.  She did well with antibiotics and by the AM of 11/28 she was tolerating a  Soft diet and ready for discharge.   CBC Latest Ref Rng & Units 08/19/2017 08/18/2017 08/17/2017  WBC 4.0 - 10.5 K/uL 9.4 11.0(H) 15.5(H)  Hemoglobin 12.0 - 15.0 g/dL 11.6(L) 11.3(L) 12.4  Hematocrit 36.0 - 46.0 % 35.6(L) 34.3(L) 37.2  Platelets 150 - 400 K/uL 198 193 190    CMP Latest Ref Rng & Units 08/19/2017 08/18/2017 08/16/2017  Glucose 65 - 99 mg/dL 94 657(Q111(H) 80   BUN 6 - 20 mg/dL 46(N22(H) 19 62(X21(H)  Creatinine 0.44 - 1.00 mg/dL 5.280.58 4.130.57 2.440.80  Sodium 135 - 145 mmol/L 140 140 140  Potassium 3.5 - 5.1 mmol/L 4.3 3.3(L) 4.2  Chloride 101 - 111 mmol/L 107 105 106  CO2 22 - 32 mmol/L 28 27 26   Calcium 8.9 - 10.3 mg/dL 8.0(L) 8.1(L) 8.5(L)  Total Protein 6.5 - 8.1 g/dL - - -  Total Bilirubin 0.3 - 1.2 mg/dL - - -  Alkaline Phos 38 - 126 U/L - - -  AST 15 - 41 U/L - - -  ALT 14 - 54 U/L - - -   Appendix, Other than Incidental - ACUTE APPENDICITIS AND SEROSITIS. - THERE IS NO EVIDENCE OF MALIGNANCY  Disposition: Home   Allergies as of 08/19/2017   No Known Allergies     Medication List    TAKE these medications   acetaminophen 325 MG tablet Commonly known as:  TYLENOL You can take 2 tablets every 4 hours as needed for pain.  You can alternate with Aleve or ibuprofen for pain control. DO NOT TAKE MORE THAN 4000 MG OF TYLENOL PER DAY.  IT CAN HARM YOUR LIVER.  TYLENOL (ACETAMINOPHEN) IS ALSO IN YOUR PRESCRIPTION PAIN MEDICATION.  YOU HAVE TO COUNT IT IN YOUR DAILY TOTAL.   amoxicillin-clavulanate 875-125 MG tablet Commonly known as:  AUGMENTIN Take 1 tablet by mouth every 12 (twelve) hours.   aspirin 81 MG chewable tablet Chew 81 mg by mouth daily.  calcium carbonate 1250 (500 Ca) MG tablet Commonly known as:  OS-CAL - dosed in mg of elemental calcium Take 1 tablet by mouth daily with breakfast.   cholecalciferol 1000 units tablet Commonly known as:  VITAMIN D Take 1,000 Units by mouth daily.   levothyroxine 75 MCG tablet Commonly known as:  SYNTHROID, LEVOTHROID Take 75 mcg by mouth daily.   losartan 25 MG tablet Commonly known as:  COZAAR Take 12.5 mg by mouth daily.   multivitamin with minerals Tabs tablet He can resume this next week when you are eating normally, and feel better. What changed:    how much to take  how to take this  when to take this  additional instructions   naproxen sodium 220 MG tablet Commonly  known as:  ALEVE Follow package direction and take every 12 hours as needed for pain   ondansetron 4 MG disintegrating tablet Commonly known as:  ZOFRAN-ODT Take 1 tablet (4 mg total) by mouth every 6 (six) hours as needed for nausea.   oxyCODONE 5 MG immediate release tablet Commonly known as:  Oxy IR/ROXICODONE Take 1-2 tablets (5-10 mg total) by mouth every 4 (four) hours as needed for moderate pain or severe pain.   saccharomyces boulardii 250 MG capsule Commonly known as:  FLORASTOR Take 1 capsule (250 mg total) by mouth 2 (two) times daily.      Follow-up Information    [facility] [place]. Call.   Specialty:  General Surgery Why:  Your appointment is at 11:15 AM.  Bring photo ID and insurance information.   Please arrive 30 minutes prior to appointment to check in and fill out paperwork. Bring photo ID.  Call your primary care doctor and follow up with home medical issues/sleep. Contact information: [address] [place] [id number] [phone]          Signed: Sherrie GeorgeJENNINGS,Zahir Eisenhour 08/19/2017, 8:32 AM

## 2017-08-19 LAB — BASIC METABOLIC PANEL
ANION GAP: 5 (ref 5–15)
BUN: 22 mg/dL — ABNORMAL HIGH (ref 6–20)
CALCIUM: 8 mg/dL — AB (ref 8.9–10.3)
CO2: 28 mmol/L (ref 22–32)
Chloride: 107 mmol/L (ref 101–111)
Creatinine, Ser: 0.58 mg/dL (ref 0.44–1.00)
GFR calc Af Amer: >60 mL/min (ref >60)
GLUCOSE: 94 mg/dL (ref 65–99)
Potassium: 4.3 mmol/L (ref 3.5–5.1)
SODIUM: 140 mmol/L (ref 135–145)

## 2017-08-19 LAB — CBC
HCT: 35.6 % — ABNORMAL LOW (ref 36.0–46.0)
Hemoglobin: 11.6 g/dL — ABNORMAL LOW (ref 12.0–15.0)
MCH: 29.2 pg (ref 26.0–34.0)
MCHC: 32.6 g/dL (ref 30.0–36.0)
MCV: 89.7 fL (ref 78.0–100.0)
PLATELETS: 198 10*3/uL (ref 150–400)
RBC: 3.97 MIL/uL (ref 3.87–5.11)
RDW: 13.4 % (ref 11.5–15.5)
WBC: 9.4 10*3/uL (ref 4.0–10.5)

## 2017-08-19 MED ORDER — ADULT MULTIVITAMIN W/MINERALS CH: ORAL_TABLET | ORAL | Status: AC

## 2017-08-19 MED ORDER — SACCHAROMYCES BOULARDII 250 MG PO CAPS: 250.0000 mg | ORAL_CAPSULE | Freq: Two times a day (BID) | ORAL | 0 refills | Status: AC

## 2017-08-19 MED ORDER — NAPROXEN SODIUM 220 MG PO TABS: ORAL_TABLET | ORAL | Status: AC

## 2017-08-19 MED ORDER — ONDANSETRON 4 MG PO TBDP: 4.0000 mg | ORAL_TABLET | Freq: Four times a day (QID) | ORAL | 0 refills | Status: AC | PRN

## 2017-08-19 MED ORDER — ACETAMINOPHEN 325 MG PO TABS: ORAL_TABLET | ORAL | Status: AC

## 2017-08-19 MED ORDER — AMOXICILLIN-POT CLAVULANATE 875-125 MG PO TABS
1.0000 | ORAL_TABLET | Freq: Two times a day (BID) | ORAL | Status: DC
  Administered 2017-08-19: 1 via ORAL
  Filled 2017-08-19: qty 1

## 2017-08-19 MED ORDER — OXYCODONE HCL 5 MG PO TABS: 5.0000 mg | ORAL_TABLET | ORAL | 0 refills | Status: AC | PRN

## 2017-08-19 MED ORDER — AMOXICILLIN-POT CLAVULANATE 875-125 MG PO TABS: 1.0000 | ORAL_TABLET | Freq: Two times a day (BID) | ORAL | 0 refills | Status: AC

## 2017-08-19 NOTE — Progress Notes (Signed)
Reviewed AVS and discharge summary with pt and son. They verbalized understanding and questions were answered to their satisfaction. Pt was discharged home in stable condition with pain controlled, printed prescriptions, and all belongings via WC.

## 2017-08-19 NOTE — Progress Notes (Signed)
5 Days Post-Op    CC: Abdominal pain  Subjective: Nausea has resolved, abdominal tightness has also resolved and she feels much more comfortable.  Tolerating diet without difficulty.  She is having some loose stools.   Objective: Vital signs in last 24 hours: Temp:  [98.1 F (36.7 C)-98.5 F (36.9 C)] 98.2 F (36.8 C) (11/28 0556) Pulse Rate:  [53-63] 59 (11/28 0556) Resp:  [18] 18 (11/28 0556) BP: (127-152)/(66-76) 152/76 (11/28 0556) SpO2:  [90 %-93 %] 91 % (11/28 0556) Last BM Date: 08/19/17 240 p.o. At 1050 urine BM x1 Afebrile vital signs are stable Labs continue to improve.  WBC down to 9.4 Intake/Output from previous day: 11/27 0701 - 11/28 0700 In: 240 [P.O.:240] Out: 1050 [Urine:1050] Intake/Output this shift: No intake/output data recorded.  General appearance: alert, cooperative and no distress Resp: clear to auscultation bilaterally GI: Soft, sore, further distention, no nausea, positive bowel sounds, port sites all look good.  Having some loose stools.  Lab Results:  Recent Labs    08/18/17 0515 08/19/17 0537  WBC 11.0* 9.4  HGB 11.3* 11.6*  HCT 34.3* 35.6*  PLT 193 198    BMET Recent Labs    08/18/17 0515 08/19/17 0537  NA 140 140  K 3.3* 4.3  CL 105 107  CO2 27 28  GLUCOSE 111* 94  BUN 19 22*  CREATININE 0.57 0.58  CALCIUM 8.1* 8.0*   PT/INR No results for input(s): LABPROT, INR in the last 72 hours.  Recent Labs  Lab 08/14/17 0055  AST 26  ALT 17  ALKPHOS 86  BILITOT 1.0  PROT 6.0*  ALBUMIN 3.4*     Lipase     Component Value Date/Time   LIPASE 16 08/14/2017 0055     Medications: . enoxaparin (LOVENOX) injection  40 mg Subcutaneous Q24H  . feeding supplement  1 Container Oral TID BM  . levothyroxine  75 mcg Oral QAC breakfast  . losartan  12.5 mg Oral Daily  . potassium chloride  20 mEq Oral BID PC  . saccharomyces boulardii  250 mg Oral BID   Anti-infectives (From admission, onward)   Start     Dose/Rate Route  Frequency Ordered Stop   08/14/17 1115  piperacillin-tazobactam (ZOSYN) IVPB 3.375 g     3.375 g 12.5 mL/hr over 240 Minutes Intravenous Every 8 hours 08/14/17 1102     08/14/17 0445  piperacillin-tazobactam (ZOSYN) IVPB 3.375 g     3.375 g 100 mL/hr over 30 Minutes Intravenous  Once 08/14/17 0433 08/14/17 0543      Assessment/Plan Perforated appendicitis with diffuse peritonitis Laparoscopic appendectomy, 08/14/17 Dr. Glenna FellowsBenjamin Hoxworth   POD# 5 WBC improving - saline lock IV - soft diet  Hypokalemia - check Mag and replace K+ PO Endometrial thickening on CT scan, 08/14/17 Hypertension Hypothyroid  ID -zosyn 11/23 >> day 5 completed, switch to Augmentin FEN -IVF, soft diet VTE -SCDs/Lovenox Foley -none Follow up -[facility] 2-3 weeks   Plan: Patient on probiotic.  We will plan to discharge her home today.  7 more days of Augmentin and follow-up in the clinic in 2 weeks.  She is also to follow-up with her primary care provider.      LOS: 5 days    Council Munguia 08/19/2017 [phone]

## 2017-08-19 NOTE — Progress Notes (Signed)
Pt reporting tightness in L side of abdomen with coughing. States it is new onset symptom and she wants to make sure it's a concern or if she is still ok to be discharged. Paged Will Marlyne BeardsJennings, PA-C.   Will returned page and states above is normal and ok to give pt 600 mb ibuprofen and 5 mg oxycodone now, then if tightness has not resolved when son arrives, give another 5 mg of oxycodone before she goes home. Will carry out plan and continue to monitor pt.

## 2018-08-18 IMAGING — CT CT ABD-PELV W/ CM
2 of 5 series · 15 of 46 positions shown, 17 images · IV contrast (ISOVUE)
Comparison: None.

CLINICAL DATA: Lower abdominal pain. Jayy lettuce ingestion
08/11/2017, 3 days prior.

EXAM:
CT ABDOMEN AND PELVIS WITH CONTRAST
TECHNIQUE: Multidetector CT imaging of the abdomen and pelvis was performed
using the standard protocol following bolus administration of
intravenous contrast.
CONTRAST:  100 cc Lsovue-KII IV

[Series 2: abd/pel with · axial · 0.71mm/px · z∈[-62,+318]mm · 12 of 88 slices shown, 14 images]
[im 6/88  soft-tissue]
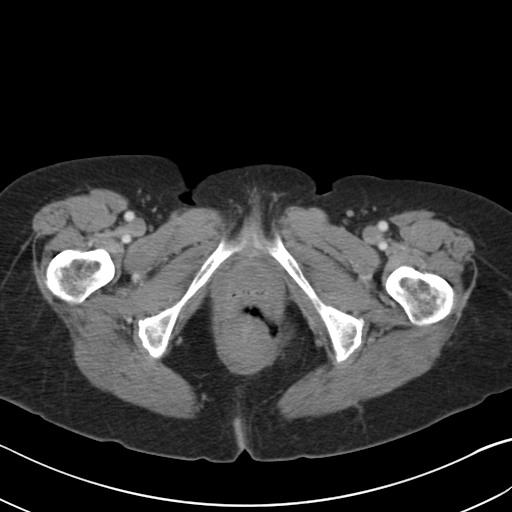
[im 6/88  bone]
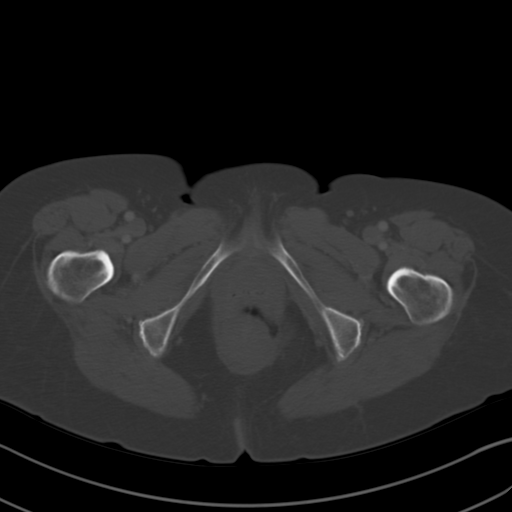
[im 11/88  soft-tissue]
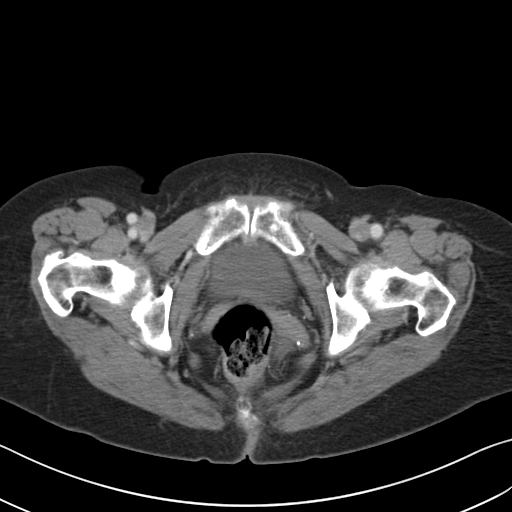
[im 22/88  soft-tissue]
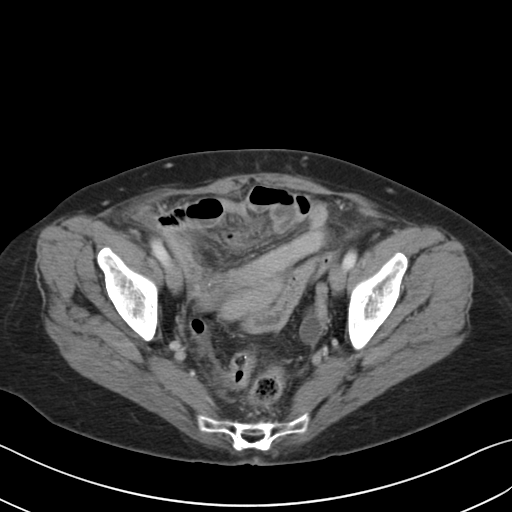
[im 28/88  soft-tissue]
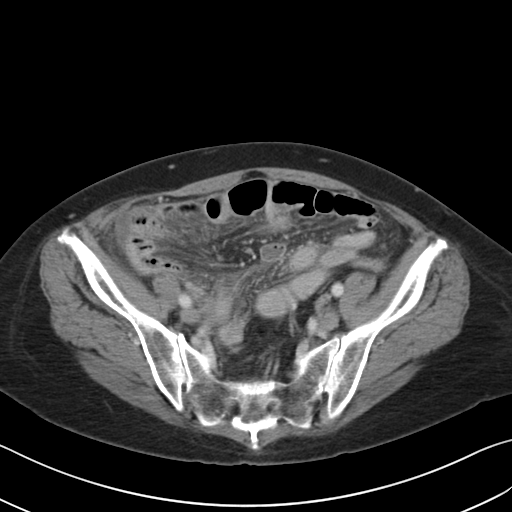
[im 33/88  soft-tissue]
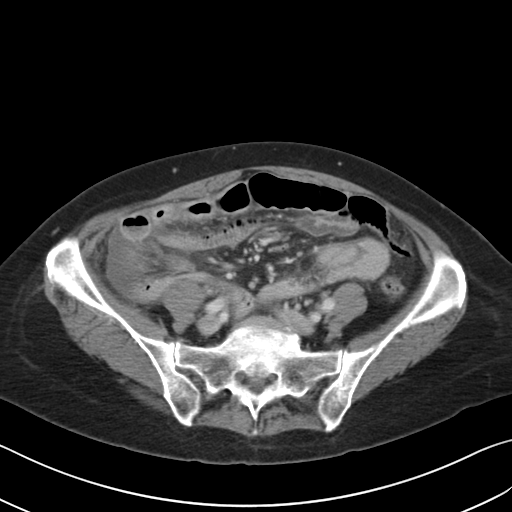
[im 39/88  soft-tissue]
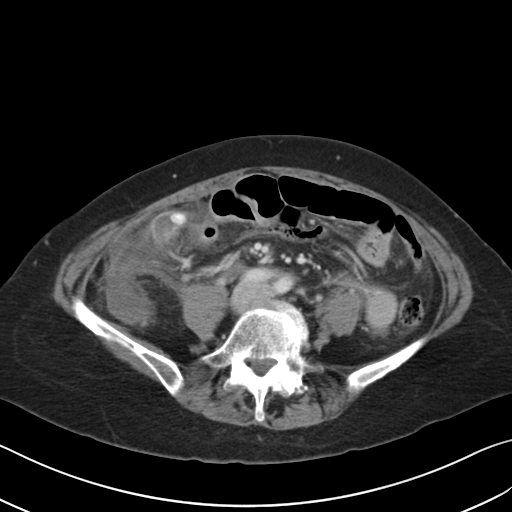
[im 49/88  soft-tissue]
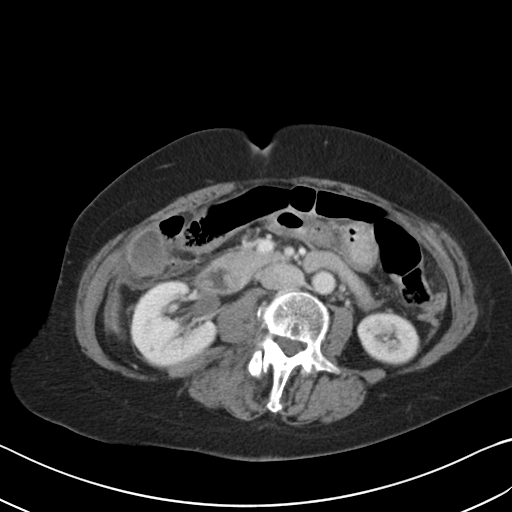
[im 55/88  soft-tissue]
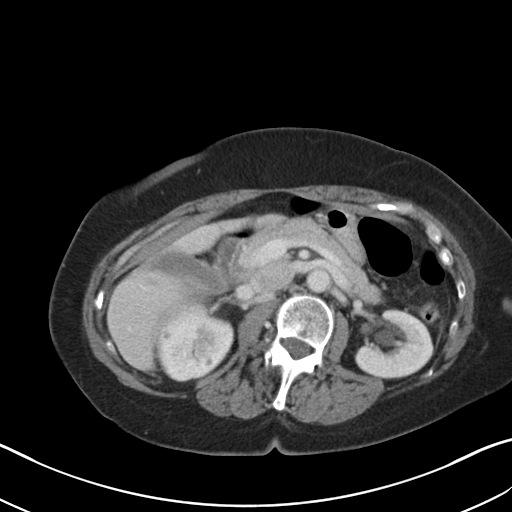
[im 60/88  soft-tissue]
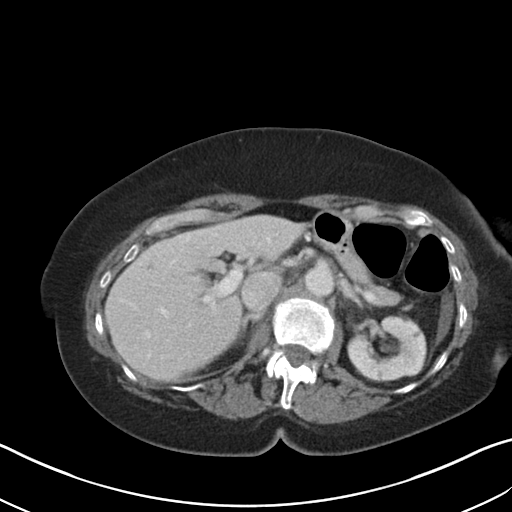
[im 60/88  bone]
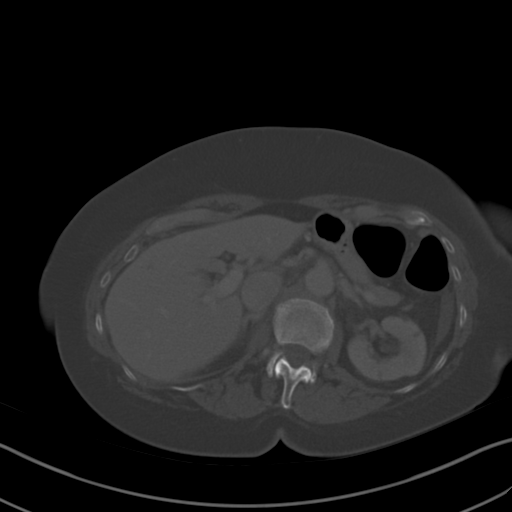
[im 66/88  soft-tissue]
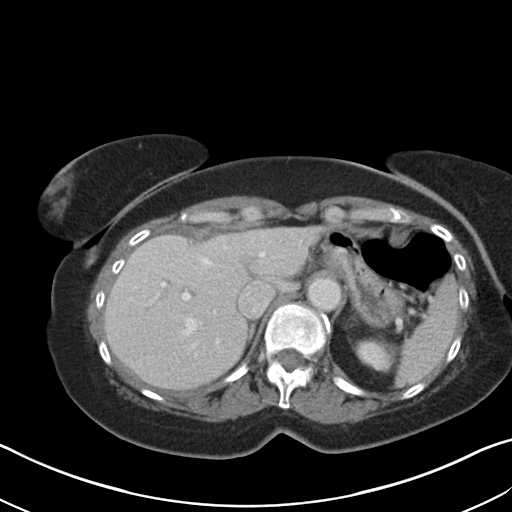
[im 77/88  soft-tissue]
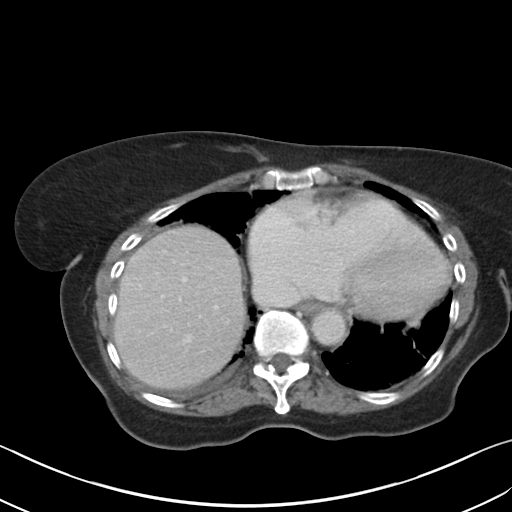
[im 82/88  soft-tissue]
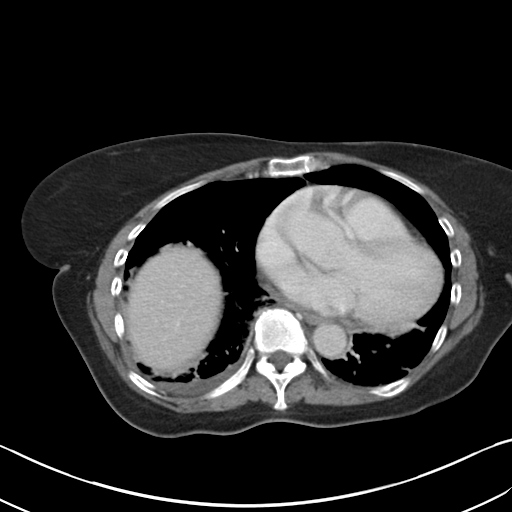

[Series 4: coronal a/|p · coronal · 0.73mm/px · 3 of 110 slices shown]
[im 37/110  soft-tissue]
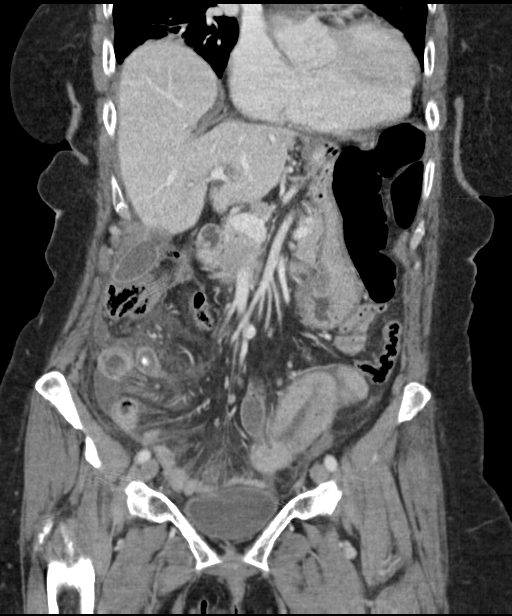
[im 49/110  soft-tissue]
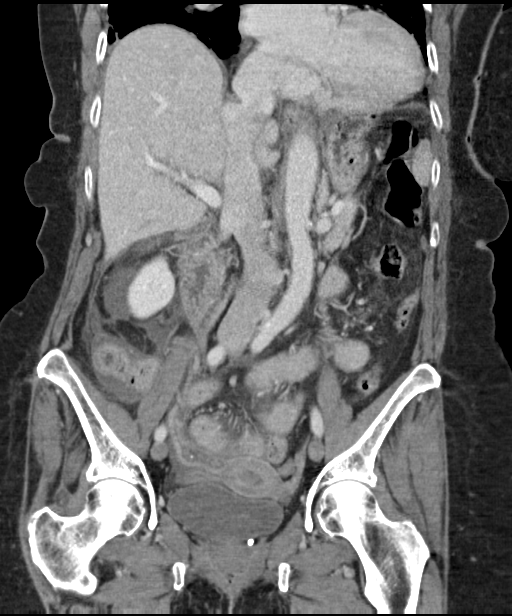
[im 61/110  soft-tissue]
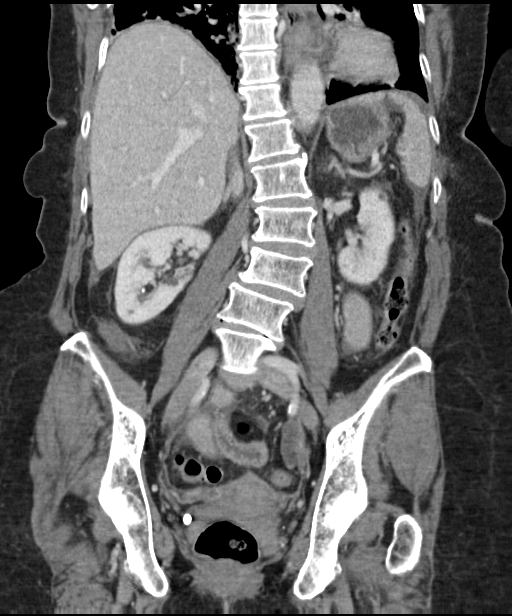

[15 of 46 positions shown; findings below may reference images not displayed]

FINDINGS: Lower chest: Small right pleural effusion and lower lobe
atelectasis. Streaky lingular and left lower lobe atelectasis.

Hepatobiliary: No focal hepatic lesion. Gallbladder is
physiologically distended without calcified gallstone. Small amount
stranding about the gallbladder fundus is felt be tracking from
right lower quadrant inflammatory process.

Pancreas: No ductal dilatation or inflammation.

Spleen: Normal in size without focal abnormality.

Adrenals/Urinary Tract: No adrenal nodule. Small low-density lesions
throughout both kidneys, majority are subcentimeter and too small to
characterized, larger lesion in the lower right kidney is consistent
with simple cyst. Urinary bladder is physiologically distended.

Stomach/Bowel: Stomach is nondistended. Proximal small bowel is
decompressed. Pelvic small bowel loops are fluid-filled with wall
thickening and mild mucosal enhancement. Dilated blind-ending
tubular structure in the right lower quadrant consistent with
dilated appendix. Significant right lower quadrant inflammatory
change. Cecum is located into the right mid abdomen. Significant
colonic tortuosity. Moderate sigmoid diverticulosis without
diverticulitis.

Appendix: Location: Retrocecal, however cecum located in the mid
abdomen.

Diameter: 16 mm.

Appendicolith: Yes, at least 3, largest measuring 8 mm at the base.

Mucosal hyper-enhancement: Yes.

Extraluminal gas: No.

Periappendiceal collection: Free fluid in the right lower quadrant
and right pericolic gutter, with possible organizing collection in
the pericolic gutter, for example image 50 series 2.

Vascular/Lymphatic: Tortuous abdominal aorta which trace
atherosclerosis. No enlarged abdominal or pelvic lymph nodes.

Reproductive: Multi septated versus 2 adjacent cysts in the left
ovary, with cystic components measuring 13 and 19 mm. Right ovary
not definitively seen. Probable exophytic partially calcified
uterine fibroid on the left. Endometrial thickening or fluid
measuring 12 mm in the fundus.

Other: Significant inflammatory change in the pelvis and right lower
quadrant with soft tissue stranding and free fluid. Mesenteric edema
and free fluid. Free fluid tracks in both pericolic gutters, left
greater than right, with possible ill-defined organizing collection
in the right lower quadrant. No free air.

Musculoskeletal: Scoliosis and degenerative change in the spine.
There are no acute or suspicious osseous abnormalities.
IMPRESSION: 1. Findings consistent with acute appendicitis. Significantly
inflamed appendix containing multiple appendicoliths. The degree of
inflammation, free fluid and probable developing organizing fluid
collection in the right pericolic gutter are suspicious for rupture.
No extraluminal air. Cecum is located in the right mid abdomen
partially distorting conventional anatomy.
2. Thickened pelvic bowel loops with mucosal enhancement likely
reactive secondary to the right lower quadrant inflammatory process.
3. Colonic diverticulosis without diverticulitis.
4. Left ovarian cysts versus septated cyst. If 2 adjacent cysts,
cysts measures 19 and 13 mm respectively. These are probably benign,
however recommend sonographic characterization on a nonemergent
basis after coalescence of acute event. Additionally there is
endometrial thickening or fluid of 12 mm, abnormal in a
postmenopausal patient. This should also be assessed with
ultrasound.
